# Patient Record
Sex: Female | Born: 1975 | Hispanic: No | Marital: Married | Smoking: Never smoker
Health system: Southern US, Community
[De-identification: ages and names within clinical notes are randomized; demographics above are authoritative.]

## PROBLEM LIST (undated history)

## (undated) DIAGNOSIS — O039 Complete or unspecified spontaneous abortion without complication: Secondary | ICD-10-CM

## (undated) DIAGNOSIS — Z789 Other specified health status: Secondary | ICD-10-CM

## (undated) DIAGNOSIS — B999 Unspecified infectious disease: Secondary | ICD-10-CM

## (undated) DIAGNOSIS — D649 Anemia, unspecified: Secondary | ICD-10-CM

## (undated) HISTORY — PX: NO PAST SURGERIES: SHX2092

## (undated) HISTORY — DX: Other specified health status: Z78.9

---

## 2011-01-23 ENCOUNTER — Inpatient Hospital Stay (HOSPITAL_COMMUNITY)
Admission: AD | Admit: 2011-01-23 | Discharge: 2011-01-23 | Disposition: A | Discharge status: Home / Self Care | Payer: Medicaid Other | Source: Ambulatory Visit | Attending: Obstetrics and Gynecology | Admitting: Obstetrics and Gynecology

## 2011-01-23 ENCOUNTER — Encounter (HOSPITAL_COMMUNITY): Payer: Self-pay | Admitting: *Deleted

## 2011-01-23 ENCOUNTER — Other Ambulatory Visit (HOSPITAL_COMMUNITY): Payer: Self-pay

## 2011-01-23 DIAGNOSIS — Z3201 Encounter for pregnancy test, result positive: Secondary | ICD-10-CM | POA: Insufficient documentation

## 2011-01-23 LAB — URINALYSIS, ROUTINE W REFLEX MICROSCOPIC
Bilirubin Urine: NEGATIVE
Glucose, UA: NEGATIVE mg/dL
Ketones, ur: NEGATIVE mg/dL
Protein, ur: NEGATIVE mg/dL

## 2011-01-23 LAB — CBC
Platelets: 162 10*3/uL (ref 150–400)
RBC: 3.89 MIL/uL (ref 3.87–5.11)
WBC: 8.1 10*3/uL (ref 4.0–10.5)

## 2011-01-23 LAB — URINE MICROSCOPIC-ADD ON

## 2011-01-23 LAB — ABO/RH: ABO/RH(D): O POS

## 2011-01-23 LAB — HCG, QUANTITATIVE, PREGNANCY: hCG, Beta Chain, Quant, S: 22585 m[IU]/mL — ABNORMAL HIGH (ref <5)

## 2011-01-23 MED ORDER — PRENATAL RX 60-1 MG PO TABS: 1.0000 | ORAL_TABLET | Freq: Every day | ORAL | Status: DC

## 2011-01-23 NOTE — ED Provider Notes (Signed)
History   Pt presents today wishing to have a preg test. She speaks Mandarin and all communication was performed via interpreter. Pt reports that she has had some mild discomfort and some "black discharge like the end of her period." Pt declines any pelvic examination. She denies fever, severe pain, or any other sx at this time.  Chief Complaint  Patient presents with  . Possible Pregnancy   HPI  OB History    Grav Para Term Preterm Abortions TAB SAB Ect Mult Living   3 1 1  0 1 1 0 0 0 1      Past Medical History  Diagnosis Date  . No pertinent past medical history     Past Surgical History  Procedure Date  . No past surgeries     No family history on file.  History  Substance Use Topics  . Smoking status: Never Smoker   . Smokeless tobacco: Not on file  . Alcohol Use: No    Allergies: No Known Allergies  No prescriptions prior to admission    Review of Systems  Constitutional: Negative for fever.  Eyes: Negative for blurred vision.  Cardiovascular: Negative for chest pain and palpitations.  Gastrointestinal: Positive for abdominal pain. Negative for nausea, vomiting, diarrhea and constipation.  Genitourinary: Negative for dysuria, urgency, frequency and hematuria.  Neurological: Negative for dizziness and headaches.  Psychiatric/Behavioral: Negative for depression and suicidal ideas.   Physical Exam   Blood pressure 99/41, pulse 71, temperature 99.1 F (37.3 C), resp. rate 16, height 5\' 5"  (1.651 m), weight 141 lb (63.957 kg), last menstrual period 12/14/2010.  Physical Exam  Nursing note and vitals reviewed. Constitutional: She appears well-developed and well-nourished. No distress.  HENT:  Head: Normocephalic and atraumatic.  Eyes: EOM are normal. Pupils are equal, round, and reactive to light.  GI: Soft. She exhibits no distension. There is no tenderness. There is no rebound and no guarding.  Skin: Skin is warm and dry. She is not diaphoretic.    Psychiatric: She has a normal mood and affect. Her behavior is normal. Judgment and thought content normal.    MAU Course  Procedures  Results for orders placed during the hospital encounter of 01/23/11 (from the past 24 hour(s))  URINALYSIS, ROUTINE W REFLEX MICROSCOPIC     Status: Abnormal   Collection Time   01/23/11  7:15 PM      Component Value Range   Color, Urine YELLOW  YELLOW    APPearance CLEAR  CLEAR    Specific Gravity, Urine <1.005 (*) 1.005 - 1.030    pH 6.0  5.0 - 8.0    Glucose, UA NEGATIVE  NEGATIVE (mg/dL)   Hgb urine dipstick TRACE (*) NEGATIVE    Bilirubin Urine NEGATIVE  NEGATIVE    Ketones, ur NEGATIVE  NEGATIVE (mg/dL)   Protein, ur NEGATIVE  NEGATIVE (mg/dL)   Urobilinogen, UA 0.2  0.0 - 1.0 (mg/dL)   Nitrite NEGATIVE  NEGATIVE    Leukocytes, UA NEGATIVE  NEGATIVE   URINE MICROSCOPIC-ADD ON     Status: Normal   Collection Time   01/23/11  7:15 PM      Component Value Range   Squamous Epithelial / LPF RARE  RARE    WBC, UA    <3 (WBC/hpf)   Value: NO FORMED ELEMENTS SEEN ON URINE MICROSCOPIC EXAMINATION   RBC / HPF 0-2  <3 (RBC/hpf)   Bacteria, UA RARE  RARE   POCT PREGNANCY, URINE     Status: Normal  Collection Time   01/23/11  7:35 PM      Component Value Range   Preg Test, Ur POSITIVE    ABO/RH     Status: Normal   Collection Time   01/23/11  8:01 PM      Component Value Range   ABO/RH(D) O POS    CBC     Status: Abnormal   Collection Time   01/23/11  8:01 PM      Component Value Range   WBC 8.1  4.0 - 10.5 (K/uL)   RBC 3.89  3.87 - 5.11 (MIL/uL)   Hemoglobin 12.4  12.0 - 15.0 (g/dL)   HCT 96.0 (*) 45.4 - 46.0 (%)   MCV 91.3  78.0 - 100.0 (fL)   MCH 31.9  26.0 - 34.0 (pg)   MCHC 34.9  30.0 - 36.0 (g/dL)   RDW 09.8  11.9 - 14.7 (%)   Platelets 162  150 - 400 (K/uL)     Assessment and Plan  Preg confirmation: pt now refuses ultrasound. She states she only wants pregnancy verification. I did discuss with her the possibility of ectopic preg  via the interpreter. Pt will be given preg verification letter and instructed to return immediately with worsening or changing symptoms.  Clinton Gallant. Rice III, DrHSc, MPAS, PA-C  01/23/2011, 8:02 PM   Henrietta Hoover, PA 01/23/11 2031

## 2011-01-23 NOTE — Progress Notes (Signed)
Pt states she feels some discomfort in her abdomen that she rates a 1. Pt states she also has a small amount of black discharge when she wipes

## 2011-01-23 NOTE — Progress Notes (Signed)
Per pt's family(pt doesn't speak english) she came in because she wants to find out if she is pregnant. States she has not had a home preg test but she "doesn't feel like eating". States her last LMP was 12/14/2010. occassional lower abd pain , none now. G2P1

## 2011-01-24 NOTE — ED Provider Notes (Signed)
Attestation of Attending Supervision of Advanced Practitioner: Evaluation and management procedures were performed by the PA/NP/CNM/OB Fellow under my supervision/collaboration. Chart reviewed and agree with management and plan.  Renada Cronin V 01/24/2011 9:51 AM

## 2011-02-14 ENCOUNTER — Inpatient Hospital Stay (HOSPITAL_COMMUNITY)
Admission: AD | Admit: 2011-02-14 | Discharge: 2011-02-14 | Disposition: A | Discharge status: Home / Self Care | Payer: Medicaid Other | Source: Ambulatory Visit | Attending: Obstetrics & Gynecology | Admitting: Obstetrics & Gynecology

## 2011-02-14 DIAGNOSIS — Z3400 Encounter for supervision of normal first pregnancy, unspecified trimester: Secondary | ICD-10-CM | POA: Insufficient documentation

## 2011-02-14 NOTE — ED Provider Notes (Signed)
[id number] presents to MAU reporting that she needs to have an ultrasound today.  She denies pain, cramping, LOF, vaginal bleeding, or any problems with her pregnancy.  She and her s/o speak only Mandarin and she is with a Nurse, learning disability.    She was seen in MAU for vaginal bleeding on 01/23/11 and had a positive UPT.  She refused an U/S at that time.   Brought pt to triage room and via translator explained that we are an emergency room and that a routine U/S cannot be performed tonight.  Gave pt and s/o information about providers in area and discussed calling to make appointment to begin prenatal care as soon as possible.  Pt states understanding via translator and left MAU at this time.  Sharen Counter, CNM

## 2011-02-14 NOTE — Progress Notes (Signed)
Pt is nonEng speaking and has a [place] friend along who states the pt was told to come back for an [place]

## 2011-04-09 LAB — OB RESULTS CONSOLE ANTIBODY SCREEN: Antibody Screen: NEGATIVE

## 2011-04-09 LAB — OB RESULTS CONSOLE ABO/RH

## 2011-04-09 LAB — OB RESULTS CONSOLE GC/CHLAMYDIA
Chlamydia: NEGATIVE
Gonorrhea: NEGATIVE

## 2011-04-10 ENCOUNTER — Other Ambulatory Visit (HOSPITAL_COMMUNITY): Payer: Self-pay | Admitting: Obstetrics and Gynecology

## 2011-04-10 DIAGNOSIS — O09529 Supervision of elderly multigravida, unspecified trimester: Secondary | ICD-10-CM

## 2011-04-18 ENCOUNTER — Ambulatory Visit (HOSPITAL_COMMUNITY)
Admission: RE | Admit: 2011-04-18 | Discharge: 2011-04-18 | Disposition: A | Discharge status: Home / Self Care | Payer: Medicaid Other | Source: Ambulatory Visit | Attending: Obstetrics and Gynecology | Admitting: Obstetrics and Gynecology

## 2011-04-18 ENCOUNTER — Ambulatory Visit (HOSPITAL_COMMUNITY): Payer: Medicaid Other

## 2011-04-18 ENCOUNTER — Other Ambulatory Visit: Payer: Self-pay

## 2011-04-18 ENCOUNTER — Other Ambulatory Visit (HOSPITAL_COMMUNITY): Payer: Self-pay | Admitting: Obstetrics and Gynecology

## 2011-04-18 ENCOUNTER — Encounter (HOSPITAL_COMMUNITY): Payer: Self-pay

## 2011-04-18 ENCOUNTER — Ambulatory Visit (HOSPITAL_COMMUNITY): Admission: RE | Admit: 2011-04-18 | Payer: Medicaid Other | Source: Ambulatory Visit

## 2011-04-18 DIAGNOSIS — Z1389 Encounter for screening for other disorder: Secondary | ICD-10-CM | POA: Insufficient documentation

## 2011-04-18 DIAGNOSIS — O09529 Supervision of elderly multigravida, unspecified trimester: Secondary | ICD-10-CM

## 2011-04-18 DIAGNOSIS — O358XX0 Maternal care for other (suspected) fetal abnormality and damage, not applicable or unspecified: Secondary | ICD-10-CM | POA: Insufficient documentation

## 2011-04-18 DIAGNOSIS — Z363 Encounter for antenatal screening for malformations: Secondary | ICD-10-CM | POA: Insufficient documentation

## 2011-04-19 ENCOUNTER — Telehealth (HOSPITAL_COMMUNITY): Payer: Self-pay | Admitting: MS"

## 2011-04-19 NOTE — Telephone Encounter (Signed)
Per arrangements from yesterday's visit, called Mandarin Chinese/English interpreter, Alvino Chapel with Ms. Cameran Gossman's preliminary preliminary FISH results from her amniocentesis. Asked interpreter to conference call patient with results. She stated that patient was at work, and that it had previously been decided that she would call them to relay results to them. We reviewed that these are within normal limits.  We again discussed the limitations of FISH and that final results are still pending and will be available in 1-2 weeks. However, the patient was not available to ask questions regarding these results. I instructed interpreter to let patient know she can call with additional questions or concerns.   Quinn Plowman, MS Patent attorney

## 2011-04-22 ENCOUNTER — Other Ambulatory Visit: Payer: Self-pay

## 2011-04-23 ENCOUNTER — Other Ambulatory Visit (HOSPITAL_COMMUNITY): Payer: Medicaid Other

## 2011-04-23 ENCOUNTER — Encounter (HOSPITAL_COMMUNITY): Payer: Self-pay

## 2011-04-26 ENCOUNTER — Telehealth (HOSPITAL_COMMUNITY): Payer: Self-pay | Admitting: MS"

## 2011-04-26 NOTE — Telephone Encounter (Signed)
Called Sharon Powers to discuss the final karyotype results from her amniocentesis. Spoke with patient's husband via Air cabin crew interpreter [phone] [facility]). We reviewed that these are within normal limits (46,XX).   Quinn Plowman, MS Patent attorney

## 2011-05-16 ENCOUNTER — Encounter (HOSPITAL_COMMUNITY): Payer: Self-pay | Admitting: *Deleted

## 2011-09-09 ENCOUNTER — Encounter (HOSPITAL_COMMUNITY): Payer: Self-pay | Admitting: *Deleted

## 2011-09-09 ENCOUNTER — Telehealth (HOSPITAL_COMMUNITY): Payer: Self-pay | Admitting: *Deleted

## 2011-09-09 NOTE — Telephone Encounter (Signed)
Preadmission screen [phone]

## 2011-09-13 ENCOUNTER — Inpatient Hospital Stay (HOSPITAL_COMMUNITY)
Admission: AD | Admit: 2011-09-13 | Discharge: 2011-09-15 | DRG: 775 | Disposition: A | Discharge status: Home / Self Care | Payer: Medicaid Other | Source: Ambulatory Visit | Attending: Obstetrics and Gynecology | Admitting: Obstetrics and Gynecology

## 2011-09-13 ENCOUNTER — Encounter (HOSPITAL_COMMUNITY): Payer: Self-pay | Admitting: *Deleted

## 2011-09-13 DIAGNOSIS — O09529 Supervision of elderly multigravida, unspecified trimester: Secondary | ICD-10-CM | POA: Diagnosis present

## 2011-09-13 LAB — CBC
Hemoglobin: 9.7 g/dL — ABNORMAL LOW (ref 12.0–15.0)
MCHC: 32.7 g/dL (ref 30.0–36.0)
RDW: 13.3 % (ref 11.5–15.5)

## 2011-09-13 LAB — RPR: RPR Ser Ql: NONREACTIVE

## 2011-09-13 MED ORDER — PRENATAL MULTIVITAMIN CH
1.0000 | ORAL_TABLET | Freq: Every day | ORAL | Status: DC
  Administered 2011-09-13 – 2011-09-15 (×3): 1 via ORAL
  Filled 2011-09-13 (×3): qty 1

## 2011-09-13 MED ORDER — WITCH HAZEL-GLYCERIN EX PADS: 1.0000 "application " | MEDICATED_PAD | CUTANEOUS | Status: DC | PRN

## 2011-09-13 MED ORDER — ONDANSETRON HCL 4 MG PO TABS: 4.0000 mg | ORAL_TABLET | ORAL | Status: DC | PRN

## 2011-09-13 MED ORDER — LACTATED RINGERS IV SOLN
INTRAVENOUS | Status: DC
  Administered 2011-09-13: 04:00:00 via INTRAVENOUS

## 2011-09-13 MED ORDER — BUTORPHANOL TARTRATE 1 MG/ML IJ SOLN: 1.0000 mg | Freq: Once | INTRAMUSCULAR | Status: DC

## 2011-09-13 MED ORDER — LACTATED RINGERS IV SOLN: 500.0000 mL | INTRAVENOUS | Status: DC | PRN

## 2011-09-13 MED ORDER — TETANUS-DIPHTH-ACELL PERTUSSIS 5-2.5-18.5 LF-MCG/0.5 IM SUSP: 0.5000 mL | Freq: Once | INTRAMUSCULAR | Status: DC

## 2011-09-13 MED ORDER — CITRIC ACID-SODIUM CITRATE 334-500 MG/5ML PO SOLN: 30.0000 mL | ORAL | Status: DC | PRN

## 2011-09-13 MED ORDER — ONDANSETRON HCL 4 MG/2ML IJ SOLN: 4.0000 mg | INTRAMUSCULAR | Status: DC | PRN

## 2011-09-13 MED ORDER — SIMETHICONE 80 MG PO CHEW: 80.0000 mg | CHEWABLE_TABLET | ORAL | Status: DC | PRN

## 2011-09-13 MED ORDER — LACTATED RINGERS IV SOLN: INTRAVENOUS | Status: DC

## 2011-09-13 MED ORDER — LIDOCAINE HCL (PF) 1 % IJ SOLN
30.0000 mL | INTRAMUSCULAR | Status: DC | PRN
  Administered 2011-09-13: 30 mL via SUBCUTANEOUS
  Filled 2011-09-13: qty 30

## 2011-09-13 MED ORDER — OXYTOCIN 40 UNITS IN LACTATED RINGERS INFUSION - SIMPLE MED: 62.5000 mL/h | INTRAVENOUS | Status: AC | PRN

## 2011-09-13 MED ORDER — MEASLES, MUMPS & RUBELLA VAC ~~LOC~~ INJ
0.5000 mL | INJECTION | Freq: Once | SUBCUTANEOUS | Status: DC
  Filled 2011-09-13: qty 0.5

## 2011-09-13 MED ORDER — OXYTOCIN 40 UNITS IN LACTATED RINGERS INFUSION - SIMPLE MED
62.5000 mL/h | Freq: Once | INTRAVENOUS | Status: AC
  Administered 2011-09-13: 62.5 mL/h via INTRAVENOUS
  Filled 2011-09-13: qty 1000

## 2011-09-13 MED ORDER — IBUPROFEN 600 MG PO TABS
600.0000 mg | ORAL_TABLET | Freq: Four times a day (QID) | ORAL | Status: DC
  Administered 2011-09-13 – 2011-09-15 (×9): 600 mg via ORAL
  Filled 2011-09-13 (×9): qty 1

## 2011-09-13 MED ORDER — LANOLIN HYDROUS EX OINT: TOPICAL_OINTMENT | CUTANEOUS | Status: DC | PRN

## 2011-09-13 MED ORDER — OXYCODONE-ACETAMINOPHEN 5-325 MG PO TABS: 1.0000 | ORAL_TABLET | ORAL | Status: DC | PRN

## 2011-09-13 MED ORDER — FLEET ENEMA 7-19 GM/118ML RE ENEM: 1.0000 | ENEMA | RECTAL | Status: DC | PRN

## 2011-09-13 MED ORDER — DIBUCAINE 1 % RE OINT: 1.0000 "application " | TOPICAL_OINTMENT | RECTAL | Status: DC | PRN

## 2011-09-13 MED ORDER — OXYTOCIN BOLUS FROM INFUSION
250.0000 mL | Freq: Once | INTRAVENOUS | Status: AC
  Administered 2011-09-13: 250 mL via INTRAVENOUS
  Filled 2011-09-13: qty 500

## 2011-09-13 MED ORDER — ACETAMINOPHEN 325 MG PO TABS: 650.0000 mg | ORAL_TABLET | ORAL | Status: DC | PRN

## 2011-09-13 MED ORDER — ZOLPIDEM TARTRATE 5 MG PO TABS: 5.0000 mg | ORAL_TABLET | Freq: Every evening | ORAL | Status: DC | PRN

## 2011-09-13 MED ORDER — IBUPROFEN 600 MG PO TABS: 600.0000 mg | ORAL_TABLET | Freq: Four times a day (QID) | ORAL | Status: DC | PRN

## 2011-09-13 MED ORDER — SENNOSIDES-DOCUSATE SODIUM 8.6-50 MG PO TABS
2.0000 | ORAL_TABLET | Freq: Every day | ORAL | Status: DC
  Administered 2011-09-14: 2 via ORAL

## 2011-09-13 MED ORDER — BENZOCAINE-MENTHOL 20-0.5 % EX AERO: 1.0000 "application " | INHALATION_SPRAY | CUTANEOUS | Status: DC | PRN

## 2011-09-13 MED ORDER — OXYCODONE-ACETAMINOPHEN 5-325 MG PO TABS
1.0000 | ORAL_TABLET | ORAL | Status: DC | PRN
Start: 2011-09-13 — End: 2011-09-15
  Administered 2011-09-14 – 2011-09-15 (×4): 1 via ORAL
  Filled 2011-09-13 (×4): qty 1

## 2011-09-13 MED ORDER — DIPHENHYDRAMINE HCL 25 MG PO CAPS: 25.0000 mg | ORAL_CAPSULE | Freq: Four times a day (QID) | ORAL | Status: DC | PRN

## 2011-09-13 MED ORDER — ONDANSETRON HCL 4 MG/2ML IJ SOLN: 4.0000 mg | Freq: Four times a day (QID) | INTRAMUSCULAR | Status: DC | PRN

## 2011-09-13 NOTE — Progress Notes (Signed)
Patient ID: Sharon Powers, female   DOB: 21-Dec-1975, 36 y.o.   MRN: [id number] DOD no complaints but language barrier

## 2011-09-13 NOTE — MAU Note (Signed)
Pt started having contractions 0220

## 2011-09-13 NOTE — Progress Notes (Signed)
UR chart review completed.  

## 2011-09-13 NOTE — H&P (Signed)
[name], Sharon Powers                     ACCOUNT NO.:  [id number]  MEDICAL RECORD NO.:  [id number]  LOCATION:  9169                          FACILITY:  [facility]  PHYSICIAN:  Malachi Pro. Ambrose Mantle, M.D. DATE OF BIRTH:  08-03-1975  DATE OF ADMISSION:  09/13/2011 DATE OF DISCHARGE:                             HISTORY & PHYSICAL   HISTORY OF PRESENT ILLNESS:  This is a 36 year old Asian female, para 1- 0-1, gravida 2, last period December 14, 2010, presented to our office at 16 weeks and 4/7th weeks gestation for her first prenatal visit. Ultrasound confirmed the date of 4 days earlier.  The patient had a quad screen that increased her risk of Down syndrome, and I even though I do not have the amniocentesis report.  The FISH was normal as a preliminary report.  The patient's prenatal course was essentially uncomplicated. She began contracting at home tonight and came to the hospital, was 4 cm in [place], was observed an hour and was found to progress to 6 cm.  Blood group and type O positive.  Negative antibody.  Pap smear normal. Rubella immune.  RPR nonreactive.  Urine culture negative.  Hepatitis B surface antigen negative, HIV negative, GC and Chlamydia negative.  She was too advanced for first trimester screen.  Cystic fibrosis screen was negative.  One-hour Glucola was 94.  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  Group B strep was negative. past medical history  No significant medical or surgical history.  No family history.  The patient denied alcohol, illicit substance abuse or tobacco during pregnancy.  OBSTETRIC HISTORY:  In 2002, she delivered an 8 pounds female infant vaginally in [place], claims that she almost did not make it to the hospital.  PHYSICAL EXAMINATION:  GENERAL:  A well-developed well-nourished Asian female, blood pressure 141/81, pulse 85, temperature has not been recorded. HEART:  Normal size and sounds.  No  murmurs. LUNGS:  Clear to auscultation.  Fundal height compatible with dates. CERVIX:  6-7 cm, 100%, vertex, -1 station.  We did speak to a translator over the telephone, and the anesthesiologist does not warm to given an epidural without a translator at the bedside.  So are going with IV pain medication, and do the best we can without the epidural.     Malachi Pro. Ambrose Mantle, M.D.     TFH/MEDQ  D:  09/13/2011  T:  09/13/2011  Job:  [id number]

## 2011-09-13 NOTE — Progress Notes (Signed)
Pt through interpreter answers md that she would like an epidural, unable to determine if pt really understands what this procedure is, anesthesia consulted and says to see if pt dilates quickly and then he will decide if epidural is safe since no interpretor would be a bedside, md says pt may have iv pain medication

## 2011-09-14 LAB — CBC
HCT: 27.8 % — ABNORMAL LOW (ref 36.0–46.0)
MCH: 28.4 pg (ref 26.0–34.0)
MCHC: 32.7 g/dL (ref 30.0–36.0)
MCV: 86.9 fL (ref 78.0–100.0)
Platelets: 183 10*3/uL (ref 150–400)
RDW: 13.6 % (ref 11.5–15.5)

## 2011-09-14 NOTE — Progress Notes (Signed)
This is a copy of the delivery note done by Dr. Ambrose Mantle for this pt that was in the wrong chart:  Pt progressed quickly to full dilatation and pushed well. Bradycardia developed in spite of lateral displacement of the uterus so a LML episiotomy was done under local block and the pt delivered with 1 contraction a living female infant 8 pounds 12 ounces with Apgars of 9 and 9 at 1 and 5 minutes. The presentation was LOA. The placenta delivered intact and there were separate membranes that were removed. The uterus was normal. There was a left vaginal laceration that was repaired with the LML episiotomy. Suture was 2-0 and 3-0 vicryl EBL 400 cc's

## 2011-09-14 NOTE — Progress Notes (Signed)
PPD #1 No problems with limited interpretation by husband Afeb, VSS Fundus firm, NT at U-1 Continue routine postpartum care

## 2011-09-15 MED ORDER — IBUPROFEN 600 MG PO TABS: 600.0000 mg | ORAL_TABLET | Freq: Four times a day (QID) | ORAL | Status: AC

## 2011-09-15 NOTE — Discharge Summary (Signed)
Obstetric Discharge Summary Reason for Admission: onset of labor Prenatal Procedures: none Intrapartum Procedures: spontaneous vaginal delivery and episiotomy LML Postpartum Procedures: none Complications-Operative and Postpartum: none Hemoglobin  Date Value Range Status  09/14/2011 9.1* 12.0 - 15.0 g/dL Final     HCT  Date Value Range Status  09/14/2011 27.8* 36.0 - 46.0 % Final    Physical Exam:  General: alert Lochia: appropriate Uterine Fundus: firm  Discharge Diagnoses: Term Pregnancy-delivered  Discharge Information: Date: 09/15/2011 Activity: pelvic rest Diet: routine Medications: Ibuprofen Condition: stable Instructions: refer to practice specific booklet Discharge to: home Follow-up Information    Follow up with [name], MD in 6 weeks.   Contact information:   [address], [address] [place] [address] [phone]          Newborn Data: Live born female  Birth Weight: 8 lb 12 oz (3969 g) APGAR: 9, 9  Home with mother.  Sharon Powers D 09/15/2011, 10:43 AM

## 2011-09-15 NOTE — Progress Notes (Signed)
PPD #2 No problems Afeb, VSS Fundus firm, NT at U-1 Continue routine postpartum care, d/c home 

## 2011-09-16 LAB — TYPE AND SCREEN
ABO/RH(D): O POS
Antibody Screen: NEGATIVE
Unit division: 0
Unit division: 0

## 2011-09-17 ENCOUNTER — Inpatient Hospital Stay (HOSPITAL_COMMUNITY): Admission: RE | Admit: 2011-09-17 | Payer: Medicaid Other | Source: Ambulatory Visit

## 2012-01-20 ENCOUNTER — Other Ambulatory Visit: Payer: Self-pay | Admitting: Specialist

## 2012-01-20 ENCOUNTER — Ambulatory Visit
Admission: RE | Admit: 2012-01-20 | Discharge: 2012-01-20 | Disposition: A | Discharge status: Home / Self Care | Payer: No Typology Code available for payment source | Source: Ambulatory Visit | Attending: Specialist | Admitting: Specialist

## 2012-01-20 DIAGNOSIS — R6889 Other general symptoms and signs: Secondary | ICD-10-CM

## 2013-11-22 ENCOUNTER — Encounter (HOSPITAL_COMMUNITY): Payer: Self-pay | Admitting: *Deleted

## 2016-04-19 ENCOUNTER — Encounter (HOSPITAL_BASED_OUTPATIENT_CLINIC_OR_DEPARTMENT_OTHER): Payer: Self-pay

## 2016-04-19 ENCOUNTER — Emergency Department (HOSPITAL_BASED_OUTPATIENT_CLINIC_OR_DEPARTMENT_OTHER)
Admission: EM | Admit: 2016-04-19 | Discharge: 2016-04-19 | Disposition: A | Discharge status: Home / Self Care | Payer: BLUE CROSS/BLUE SHIELD | Attending: Emergency Medicine | Admitting: Emergency Medicine

## 2016-04-19 DIAGNOSIS — N12 Tubulo-interstitial nephritis, not specified as acute or chronic: Secondary | ICD-10-CM | POA: Insufficient documentation

## 2016-04-19 DIAGNOSIS — R1032 Left lower quadrant pain: Secondary | ICD-10-CM | POA: Diagnosis present

## 2016-04-19 LAB — CBC WITH DIFFERENTIAL/PLATELET
BASOS ABS: 0 10*3/uL (ref 0.0–0.1)
Basophils Relative: 0 %
Eosinophils Absolute: 0 10*3/uL (ref 0.0–0.7)
Eosinophils Relative: 0 %
HEMATOCRIT: 35.8 % — AB (ref 36.0–46.0)
HEMOGLOBIN: 12.3 g/dL (ref 12.0–15.0)
Lymphocytes Relative: 8 %
Lymphs Abs: 0.7 10*3/uL (ref 0.7–4.0)
MCH: 31.7 pg (ref 26.0–34.0)
MCHC: 34.4 g/dL (ref 30.0–36.0)
MCV: 92.3 fL (ref 78.0–100.0)
Monocytes Absolute: 0.6 10*3/uL (ref 0.1–1.0)
Monocytes Relative: 7 %
NEUTROS ABS: 8.3 10*3/uL — AB (ref 1.7–7.7)
NEUTROS PCT: 85 %
Platelets: 149 10*3/uL — ABNORMAL LOW (ref 150–400)
RBC: 3.88 MIL/uL (ref 3.87–5.11)
RDW: 12.6 % (ref 11.5–15.5)
WBC: 9.6 10*3/uL (ref 4.0–10.5)

## 2016-04-19 LAB — URINALYSIS, MICROSCOPIC (REFLEX)

## 2016-04-19 LAB — URINALYSIS, ROUTINE W REFLEX MICROSCOPIC
Bilirubin Urine: NEGATIVE
Glucose, UA: NEGATIVE mg/dL
KETONES UR: NEGATIVE mg/dL
NITRITE: POSITIVE — AB
Protein, ur: 30 mg/dL — AB
Specific Gravity, Urine: 1.014 (ref 1.005–1.030)
pH: 7 (ref 5.0–8.0)

## 2016-04-19 LAB — COMPREHENSIVE METABOLIC PANEL
ALK PHOS: 50 U/L (ref 38–126)
ALT: 14 U/L (ref 14–54)
ANION GAP: 7 (ref 5–15)
AST: 18 U/L (ref 15–41)
Albumin: 4.4 g/dL (ref 3.5–5.0)
BILIRUBIN TOTAL: 0.9 mg/dL (ref 0.3–1.2)
BUN: 12 mg/dL (ref 6–20)
CO2: 27 mmol/L (ref 22–32)
Calcium: 9.2 mg/dL (ref 8.9–10.3)
Chloride: 100 mmol/L — ABNORMAL LOW (ref 101–111)
Creatinine, Ser: 0.68 mg/dL (ref 0.44–1.00)
Glucose, Bld: 115 mg/dL — ABNORMAL HIGH (ref 65–99)
Potassium: 4 mmol/L (ref 3.5–5.1)
SODIUM: 134 mmol/L — AB (ref 135–145)
TOTAL PROTEIN: 7.8 g/dL (ref 6.5–8.1)

## 2016-04-19 LAB — PREGNANCY, URINE: Preg Test, Ur: NEGATIVE

## 2016-04-19 MED ORDER — CEPHALEXIN 500 MG PO CAPS: 500.0000 mg | ORAL_CAPSULE | Freq: Three times a day (TID) | ORAL | 0 refills | Status: DC

## 2016-04-19 MED ORDER — DEXTROSE 5 % IV SOLN
1.0000 g | Freq: Once | INTRAVENOUS | Status: AC
  Administered 2016-04-19: 1 g via INTRAVENOUS
  Filled 2016-04-19: qty 10

## 2016-04-19 MED ORDER — ACETAMINOPHEN 325 MG PO TABS
650.0000 mg | ORAL_TABLET | Freq: Once | ORAL | Status: AC | PRN
  Administered 2016-04-19: 650 mg via ORAL
  Filled 2016-04-19: qty 2

## 2016-04-19 MED ORDER — SODIUM CHLORIDE 0.9 % IV BOLUS (SEPSIS)
1000.0000 mL | Freq: Once | INTRAVENOUS | Status: AC
  Administered 2016-04-19: 1000 mL via INTRAVENOUS

## 2016-04-19 NOTE — Discharge Planning (Signed)
Pt up for discharge. EDCM reviewed chart for possible CM needs.  No needs identified or communicated.  

## 2016-04-19 NOTE — ED Provider Notes (Signed)
[facility] Note   CSN: [id number] Arrival date & time: 04/19/16  1143     History   Chief Complaint Chief Complaint  Patient presents with  . Dizziness    HPI Sharon Powers Ante is a 41 y.o. female.  The history is provided by the patient and a relative. A language interpreter was used.  Dizziness   Sharon Powers is a 41 y.o. female who presents to the Emergency Department complaining of back pain, dizziness.  She presents to emergency department accompanied by her daughter for evaluation of dizziness and low back pain. She's been feeling poorly since yesterday with pain in her left low back and left lower quadrant with associated nausea and dizziness. No reports of head injury, vomiting, dysuria. Symptoms are moderate and constant in nature. Past Medical History:  Diagnosis Date  . Language barrier    Mandarin [place]  . No pertinent past medical history     There are no active problems to display for this patient.   Past Surgical History:  Procedure Laterality Date  . NO PAST SURGERIES      OB History    Gravida Para Term Preterm AB Living   0 0 2   SAB TAB Ectopic Multiple Live Births   0 0 0 0 2       Home Medications    Prior to Admission medications   Medication Sig Start Date End Date Taking? Authorizing Provider  cephALEXin (KEFLEX) 500 MG capsule Take 1 capsule (500 mg total) by mouth 3 (three) times daily. 04/19/16   Tilden Fossa, MD    Family History Family History  Problem Relation Age of Onset  . Alcohol abuse Neg Hx   . Arthritis Neg Hx   . Asthma Neg Hx   . Birth defects Neg Hx   . Cancer Neg Hx   . COPD Neg Hx   . Depression Neg Hx   . Diabetes Neg Hx   . Drug abuse Neg Hx   . Early death Neg Hx   . Hearing loss Neg Hx   . Heart disease Neg Hx   . Hyperlipidemia Neg Hx   . Hypertension Neg Hx   . Kidney disease Neg Hx   . Learning disabilities Neg Hx   . Mental illness Neg Hx   . Mental retardation Neg Hx    . Miscarriages / Stillbirths Neg Hx   . Stroke Neg Hx   . Vision loss Neg Hx     Social History Social History  Substance Use Topics  . Smoking status: Never Smoker  . Smokeless tobacco: Never Used  . Alcohol use No     Allergies   Patient has no known allergies.   Review of Systems Review of Systems  Neurological: Positive for dizziness.  All other systems reviewed and are negative.    Physical Exam Updated Vital Signs BP 108/72 (BP Location: Right Arm)   Pulse 98   Temp 100.2 F (37.9 C) (Oral)   Resp 18   LMP 03/30/2016   Physical Exam  Constitutional: She is oriented to person, place, and time. She appears well-developed and well-nourished.  HENT:  Head: Normocephalic and atraumatic.  Cardiovascular: Normal rate and regular rhythm.   No murmur heard. Pulmonary/Chest: Effort normal and breath sounds normal. No respiratory distress.  Abdominal: Soft. There is no tenderness. There is no rebound and no guarding.  Left CVA tenderness  Musculoskeletal: She exhibits no edema or tenderness.  Neurological: She is alert and oriented to person, place, and time.  Skin: Skin is warm and dry.  Psychiatric: She has a normal mood and affect. Her behavior is normal.  Nursing note and vitals reviewed.    ED Treatments / Results  Labs (all labs ordered are listed, but only abnormal results are displayed) Labs Reviewed  URINALYSIS, ROUTINE W REFLEX MICROSCOPIC - Abnormal; Notable for the following:       Result Value   APPearance CLOUDY (*)    Hgb urine dipstick LARGE (*)    Protein, ur 30 (*)    Nitrite POSITIVE (*)    Leukocytes, UA LARGE (*)    All other components within normal limits  URINALYSIS, MICROSCOPIC (REFLEX) - Abnormal; Notable for the following:    Bacteria, UA MANY (*)    Squamous Epithelial / LPF TOO NUMEROUS TO COUNT (*)    All other components within normal limits  COMPREHENSIVE METABOLIC PANEL - Abnormal; Notable for the following:    Sodium  134 (*)    Chloride 100 (*)    Glucose, Bld 115 (*)    All other components within normal limits  CBC WITH DIFFERENTIAL/PLATELET - Abnormal; Notable for the following:    HCT 35.8 (*)    Platelets 149 (*)    Neutro Abs 8.3 (*)    All other components within normal limits  URINE CULTURE  PREGNANCY, URINE    EKG  EKG Interpretation None       Radiology No results found.  Procedures Procedures (including critical care time)  Medications Ordered in ED Medications  acetaminophen (TYLENOL) tablet 650 mg (650 mg Oral Given 04/19/16 1241)  sodium chloride 0.9 % bolus 1,000 mL (0 mLs Intravenous Stopped 04/19/16 1424)  cefTRIAXone (ROCEPHIN) 1 g in dextrose 5 % 50 mL IVPB (0 g Intravenous Stopped 04/19/16 1416)     Initial Impression / Assessment and Plan / ED Course  I have reviewed the triage vital signs and the nursing notes.  Pertinent labs & imaging results that were available during my care of the patient were reviewed by me and considered in my medical decision making (see chart for details).     Patient here for evaluation of low back pain and fevers. She is nontoxic appearing on examination with no abdominal tenderness. She does have CVA tenderness on exam. UA is concerning for UTI in setting of her symptoms even though epithelial cells are present. Providing IV fluids and antibiotics for urinary tract infection. Discussed home care, outpatient follow-up and return precautions.  Presentation is not consistent with appendicitis, diverticulitis.  Final Clinical Impressions(s) / ED Diagnoses   Final diagnoses:  Pyelonephritis    New Prescriptions New Prescriptions   CEPHALEXIN (KEFLEX) 500 MG CAPSULE    Take 1 capsule (500 mg total) by mouth 3 (three) times daily.     Tilden Fossa, MD 04/19/16 1430

## 2016-04-19 NOTE — ED Triage Notes (Signed)
Thru interpreter line-pt c/o dizziness, lower back pain, chills,fever x 2 days-NAD-steady gait

## 2016-04-21 ENCOUNTER — Encounter (HOSPITAL_BASED_OUTPATIENT_CLINIC_OR_DEPARTMENT_OTHER): Payer: Self-pay | Admitting: Emergency Medicine

## 2016-04-21 ENCOUNTER — Inpatient Hospital Stay (HOSPITAL_BASED_OUTPATIENT_CLINIC_OR_DEPARTMENT_OTHER)
Admission: EM | Admit: 2016-04-21 | Discharge: 2016-04-23 | DRG: 690 | Disposition: A | Discharge status: Home / Self Care | Payer: BLUE CROSS/BLUE SHIELD | Attending: Internal Medicine | Admitting: Internal Medicine

## 2016-04-21 ENCOUNTER — Emergency Department (HOSPITAL_BASED_OUTPATIENT_CLINIC_OR_DEPARTMENT_OTHER): Payer: BLUE CROSS/BLUE SHIELD

## 2016-04-21 DIAGNOSIS — N10 Acute tubulo-interstitial nephritis: Principal | ICD-10-CM | POA: Diagnosis present

## 2016-04-21 DIAGNOSIS — B9620 Unspecified Escherichia coli [E. coli] as the cause of diseases classified elsewhere: Secondary | ICD-10-CM | POA: Diagnosis present

## 2016-04-21 DIAGNOSIS — E876 Hypokalemia: Secondary | ICD-10-CM | POA: Diagnosis present

## 2016-04-21 DIAGNOSIS — R509 Fever, unspecified: Secondary | ICD-10-CM

## 2016-04-21 DIAGNOSIS — D696 Thrombocytopenia, unspecified: Secondary | ICD-10-CM | POA: Diagnosis present

## 2016-04-21 DIAGNOSIS — N12 Tubulo-interstitial nephritis, not specified as acute or chronic: Secondary | ICD-10-CM

## 2016-04-21 DIAGNOSIS — N151 Renal and perinephric abscess: Secondary | ICD-10-CM

## 2016-04-21 DIAGNOSIS — D649 Anemia, unspecified: Secondary | ICD-10-CM | POA: Diagnosis present

## 2016-04-21 DIAGNOSIS — D6959 Other secondary thrombocytopenia: Secondary | ICD-10-CM | POA: Diagnosis present

## 2016-04-21 DIAGNOSIS — Z789 Other specified health status: Secondary | ICD-10-CM

## 2016-04-21 LAB — CBC WITH DIFFERENTIAL/PLATELET
Basophils Absolute: 0 10*3/uL (ref 0.0–0.1)
Basophils Relative: 0 %
Eosinophils Absolute: 0 10*3/uL (ref 0.0–0.7)
Eosinophils Relative: 0 %
HEMATOCRIT: 34.9 % — AB (ref 36.0–46.0)
Hemoglobin: 11.9 g/dL — ABNORMAL LOW (ref 12.0–15.0)
LYMPHS ABS: 0.8 10*3/uL (ref 0.7–4.0)
LYMPHS PCT: 15 %
MCH: 31.4 pg (ref 26.0–34.0)
MCHC: 34.1 g/dL (ref 30.0–36.0)
MCV: 92.1 fL (ref 78.0–100.0)
MONOS PCT: 15 %
Monocytes Absolute: 0.8 10*3/uL (ref 0.1–1.0)
NEUTROS ABS: 3.9 10*3/uL (ref 1.7–7.7)
Neutrophils Relative %: 70 %
Platelets: 144 10*3/uL — ABNORMAL LOW (ref 150–400)
RBC: 3.79 MIL/uL — ABNORMAL LOW (ref 3.87–5.11)
RDW: 12.5 % (ref 11.5–15.5)
WBC: 5.6 10*3/uL (ref 4.0–10.5)

## 2016-04-21 LAB — BASIC METABOLIC PANEL
Anion gap: 9 (ref 5–15)
BUN: 7 mg/dL (ref 6–20)
CALCIUM: 8.4 mg/dL — AB (ref 8.9–10.3)
CHLORIDE: 102 mmol/L (ref 101–111)
CO2: 23 mmol/L (ref 22–32)
CREATININE: 0.51 mg/dL (ref 0.44–1.00)
GFR calc Af Amer: >60 mL/min (ref >60)
GFR calc non Af Amer: >60 mL/min (ref >60)
GLUCOSE: 110 mg/dL — AB (ref 65–99)
Potassium: 3.4 mmol/L — ABNORMAL LOW (ref 3.5–5.1)
Sodium: 134 mmol/L — ABNORMAL LOW (ref 135–145)

## 2016-04-21 LAB — URINE CULTURE

## 2016-04-21 LAB — URINALYSIS, MICROSCOPIC (REFLEX)

## 2016-04-21 LAB — URINALYSIS, ROUTINE W REFLEX MICROSCOPIC
BILIRUBIN URINE: NEGATIVE
GLUCOSE, UA: NEGATIVE mg/dL
KETONES UR: NEGATIVE mg/dL
Nitrite: NEGATIVE
PH: 6 (ref 5.0–8.0)
Protein, ur: NEGATIVE mg/dL
SPECIFIC GRAVITY, URINE: 1.004 — AB (ref 1.005–1.030)

## 2016-04-21 MED ORDER — SODIUM CHLORIDE 0.9 % IV BOLUS (SEPSIS)
1000.0000 mL | Freq: Once | INTRAVENOUS | Status: AC
  Administered 2016-04-21: 1000 mL via INTRAVENOUS

## 2016-04-21 MED ORDER — ACETAMINOPHEN 325 MG PO TABS
650.0000 mg | ORAL_TABLET | Freq: Once | ORAL | Status: AC
  Administered 2016-04-21: 650 mg via ORAL
  Filled 2016-04-21: qty 2

## 2016-04-21 MED ORDER — DEXTROSE 5 % IV SOLN
2.0000 g | Freq: Once | INTRAVENOUS | Status: AC
  Administered 2016-04-21: 2 g via INTRAVENOUS
  Filled 2016-04-21: qty 2

## 2016-04-21 MED ORDER — IOPAMIDOL (ISOVUE-300) INJECTION 61%
100.0000 mL | Freq: Once | INTRAVENOUS | Status: AC | PRN
  Administered 2016-04-21: 100 mL via INTRAVENOUS

## 2016-04-21 MED ORDER — CEFTRIAXONE SODIUM 1 G IJ SOLR: 1.0000 g | Freq: Once | INTRAMUSCULAR | Status: DC

## 2016-04-21 MED ORDER — DEXTROSE 5 % IV SOLN: 1.0000 g | Freq: Once | INTRAVENOUS | Status: DC

## 2016-04-21 NOTE — ED Notes (Signed)
Blood cultures obtained x2.  

## 2016-04-21 NOTE — ED Provider Notes (Signed)
[facility] Provider Note   CSN: [id number] Arrival date & time: 04/21/16  1749   By signing my name below, I, Soijett Blue, attest that this documentation has been prepared under the direction and in the presence of Lyndal Pulley, MD. Electronically Signed: Soijett Blue, ED Scribe. 04/21/16. 6:49 PM.  History   Chief Complaint No chief complaint on file.   HPI Sharon Powers is a 41 y.o. female who presents to the Emergency Department complaining of intermittent fever onset 2 days ago. Pt reports associated left flank pain. Pt has tried Rx 7 day course of keflex with no relief of her symptoms. She notes that she was seen in the ED several days ago and diagnosed with an UTI and Rx keflex. She denies abdominal pain and any other symptoms.    The history is provided by the patient and the spouse. A language interpreter was used (Mandarin, [place]).    Past Medical History:  Diagnosis Date  . Language barrier    Mandarin [place]  . No pertinent past medical history     There are no active problems to display for this patient.   Past Surgical History:  Procedure Laterality Date  . NO PAST SURGERIES      OB History    Gravida Para Term Preterm AB Living   0 0 2   SAB TAB Ectopic Multiple Live Births   0 0 0 0 2       Home Medications    Prior to Admission medications   Medication Sig Start Date End Date Taking? Authorizing Provider  cephALEXin (KEFLEX) 500 MG capsule Take 1 capsule (500 mg total) by mouth 3 (three) times daily. 04/19/16   Tilden Fossa, MD    Family History Family History  Problem Relation Age of Onset  . Alcohol abuse Neg Hx   . Arthritis Neg Hx   . Asthma Neg Hx   . Birth defects Neg Hx   . Cancer Neg Hx   . COPD Neg Hx   . Depression Neg Hx   . Diabetes Neg Hx   . Drug abuse Neg Hx   . Early death Neg Hx   . Hearing loss Neg Hx   . Heart disease Neg Hx   . Hyperlipidemia Neg Hx   . Hypertension Neg Hx   . Kidney  disease Neg Hx   . Learning disabilities Neg Hx   . Mental illness Neg Hx   . Mental retardation Neg Hx   . Miscarriages / Stillbirths Neg Hx   . Stroke Neg Hx   . Vision loss Neg Hx     Social History Social History  Substance Use Topics  . Smoking status: Never Smoker  . Smokeless tobacco: Never Used  . Alcohol use No     Allergies   Patient has no known allergies.   Review of Systems Review of Systems  All other systems reviewed and are negative.    Physical Exam Updated Vital Signs BP 115/70 (BP Location: Left Arm)   Pulse (!) 104   Temp (!) 101.5 F (38.6 C) (Oral)   Resp (!) 24   LMP 03/30/2016   SpO2 97%   Physical Exam  Constitutional: She is oriented to person, place, and time. She appears well-developed and well-nourished. No distress.  HENT:  Head: Normocephalic.  Nose: Nose normal.  Eyes: Conjunctivae are normal.  Neck: Neck supple. No tracheal deviation present.  Cardiovascular: Normal rate and regular rhythm.  Pulmonary/Chest: Effort normal. No respiratory distress.  Abdominal: Soft. She exhibits no distension. There is CVA tenderness.  Left CVA tenderness  Neurological: She is alert and oriented to person, place, and time.  Skin: Skin is warm and dry.  Psychiatric: She has a normal mood and affect.     ED Treatments / Results  DIAGNOSTIC STUDIES: Oxygen Saturation is 97% on RA, nl by my interpretation.    COORDINATION OF CARE: 6:48 PM Discussed treatment plan with pt at bedside which includes labs, UA, CT abdomen with pelvis, and pt agreed to plan.  8:38 PM- Consult with Dr. Toniann Fail who recommends admission to the hospital.   Labs (all labs ordered are listed, but only abnormal results are displayed) Labs Reviewed  CBC WITH DIFFERENTIAL/PLATELET - Abnormal; Notable for the following:       Result Value   RBC 3.79 (*)    Hemoglobin 11.9 (*)    HCT 34.9 (*)    Platelets 144 (*)    All other components within normal limits    BASIC METABOLIC PANEL - Abnormal; Notable for the following:    Sodium 134 (*)    Potassium 3.4 (*)    Glucose, Bld 110 (*)    Calcium 8.4 (*)    All other components within normal limits  URINALYSIS, ROUTINE W REFLEX MICROSCOPIC - Abnormal; Notable for the following:    Specific Gravity, Urine 1.004 (*)    Hgb urine dipstick LARGE (*)    Leukocytes, UA SMALL (*)    All other components within normal limits  URINALYSIS, MICROSCOPIC (REFLEX) - Abnormal; Notable for the following:    Bacteria, UA RARE (*)    Squamous Epithelial / LPF 0-5 (*)    All other components within normal limits  URINE CULTURE  CULTURE, BLOOD (ROUTINE X 2)  CULTURE, BLOOD (ROUTINE X 2)    EKG  EKG Interpretation None       Radiology Ct Abdomen Pelvis W Contrast  Result Date: 04/21/2016 CLINICAL DATA:  Bilateral flank pain x1 week. History of pyelonephritis. EXAM: CT ABDOMEN AND PELVIS WITH CONTRAST TECHNIQUE: Multidetector CT imaging of the abdomen and pelvis was performed using the standard protocol following bolus administration of intravenous contrast. CONTRAST:  ISOVUE-300 IOPAMIDOL (ISOVUE-300) INJECTION 61% COMPARISON:  None. FINDINGS: LOWER CHEST: Lung bases are clear. Included heart size is normal. No pericardial effusion. HEPATOBILIARY: No biliary dilatation. Small hypodensity adjacent to the falciform may represent fatty infiltration. Tiny polyp along the nondependent wall of the gallbladder measuring 2-3 mm, series 2, image 30 is suggested. PANCREAS: Normal. SPLEEN: Normal. ADRENALS/URINARY TRACT: Kidneys are orthotopic, demonstrating subtle ill-defined interpolar to left lower pole hypodensity on the initial postcontrast images with loss of cortical rim on delayed imaging, series 7, image 26. No nephrolithiasis, hydronephrosis or solid renal masses. The unopacified ureters are normal in course and caliber. Urinary bladder is partially distended and unremarkable. Normal adrenal glands.  STOMACH/BOWEL: The stomach, small and large bowel are normal in course and caliber without inflammatory changes. Normal appendix. VASCULAR/LYMPHATIC: Aortoiliac vessels are normal in course and caliber. No lymphadenopathy by CT size criteria. REPRODUCTIVE: Normal. OTHER: No intraperitoneal free fluid or free air. MUSCULOSKELETAL: Nonacute. IMPRESSION: Ill-defined hypodensity involving the mid to lower left kidney with loss of cortical rim enhancement suggesting developing lobar nephronia, an intermediate stage between acute pyelonephritis and renal abscess. Tiny 2-3 mm nondependent gallbladder wall density suspicious for tiny polyp. Electronically Signed   By: Tollie Eth M.D.   On: 04/21/2016 19:59  Procedures Procedures (including critical care time)  Medications Ordered in ED Medications  acetaminophen (TYLENOL) tablet 650 mg (650 mg Oral Given 04/21/16 1836)  iopamidol (ISOVUE-300) 61 % injection 100 mL (100 mLs Intravenous Contrast Given 04/21/16 1907)  sodium chloride 0.9 % bolus 1,000 mL (1,000 mLs Intravenous Transfusing/Transfer 04/21/16 2224)  cefTRIAXone (ROCEPHIN) 2 g in dextrose 5 % 50 mL IVPB (0 g Intravenous Stopped 04/21/16 2224)     Initial Impression / Assessment and Plan / ED Course  I have reviewed the triage vital signs and the nursing notes.  Pertinent labs & imaging results that were available during my care of the patient were reviewed by me and considered in my medical decision making (see chart for details).     41 y.o. female presents with recent diagnosis for pyelonephritis clinically and placed on keflex at home. Has continued to have fevers despite ABx therapy, CT to evaluate for complicated UTI. Previous culture was pan-sensitive e coli.  Has signs of pyelo vs early renal abscess, will treat with 2g rocephin and admit for failure of OP therapy.   Final Clinical Impressions(s) / ED Diagnoses   Final diagnoses:  Failure of outpatient treatment  Fever in adult    Pyelonephritis  Renal abscess    New Prescriptions New Prescriptions   No medications on file   I personally performed the services described in this documentation, which was scribed in my presence. The recorded information has been reviewed and is accurate.      Lyndal Pulley, MD 04/22/16 [phone]

## 2016-04-21 NOTE — ED Notes (Signed)
Pt had ibuprofen 1 hour ago.

## 2016-04-21 NOTE — ED Notes (Addendum)
Spoke with interpreter service in Mandarin and MD explained the admission process. RN also explained the admission process through the interpreter and all questions were answered.  Pt denies any pain at present. Hospital information (directions and room number) given to pt's husband.

## 2016-04-21 NOTE — ED Triage Notes (Signed)
Pt seen here a few days ago dx with pyelonephritis, states symptoms continue. Taking abx as prescribed. Pt speaks [place].

## 2016-04-21 NOTE — ED Notes (Signed)
Used Video for interpreter

## 2016-04-21 NOTE — ED Notes (Signed)
Patient updated by RN via interpreter # [phone].

## 2016-04-22 ENCOUNTER — Encounter (HOSPITAL_COMMUNITY): Payer: Self-pay | Admitting: Internal Medicine

## 2016-04-22 ENCOUNTER — Telehealth: Payer: Self-pay | Admitting: Emergency Medicine

## 2016-04-22 DIAGNOSIS — E876 Hypokalemia: Secondary | ICD-10-CM | POA: Diagnosis present

## 2016-04-22 DIAGNOSIS — N12 Tubulo-interstitial nephritis, not specified as acute or chronic: Secondary | ICD-10-CM

## 2016-04-22 DIAGNOSIS — D649 Anemia, unspecified: Secondary | ICD-10-CM

## 2016-04-22 DIAGNOSIS — N10 Acute tubulo-interstitial nephritis: Secondary | ICD-10-CM | POA: Diagnosis present

## 2016-04-22 DIAGNOSIS — R509 Fever, unspecified: Secondary | ICD-10-CM | POA: Diagnosis not present

## 2016-04-22 DIAGNOSIS — D696 Thrombocytopenia, unspecified: Secondary | ICD-10-CM | POA: Diagnosis present

## 2016-04-22 DIAGNOSIS — N151 Renal and perinephric abscess: Secondary | ICD-10-CM | POA: Diagnosis present

## 2016-04-22 DIAGNOSIS — D6959 Other secondary thrombocytopenia: Secondary | ICD-10-CM | POA: Diagnosis present

## 2016-04-22 DIAGNOSIS — B9620 Unspecified Escherichia coli [E. coli] as the cause of diseases classified elsewhere: Secondary | ICD-10-CM | POA: Diagnosis present

## 2016-04-22 DIAGNOSIS — Z789 Other specified health status: Secondary | ICD-10-CM | POA: Diagnosis not present

## 2016-04-22 LAB — COMPREHENSIVE METABOLIC PANEL
ALBUMIN: 3.4 g/dL — AB (ref 3.5–5.0)
ALK PHOS: 45 U/L (ref 38–126)
ALT: 17 U/L (ref 14–54)
ANION GAP: 8 (ref 5–15)
AST: 23 U/L (ref 15–41)
BILIRUBIN TOTAL: 0.4 mg/dL (ref 0.3–1.2)
BUN: 5 mg/dL — ABNORMAL LOW (ref 6–20)
CALCIUM: 8.1 mg/dL — AB (ref 8.9–10.3)
CO2: 24 mmol/L (ref 22–32)
Chloride: 105 mmol/L (ref 101–111)
Creatinine, Ser: 0.54 mg/dL (ref 0.44–1.00)
GFR calc Af Amer: >60 mL/min (ref >60)
GFR calc non Af Amer: >60 mL/min (ref >60)
GLUCOSE: 94 mg/dL (ref 65–99)
Potassium: 3.4 mmol/L — ABNORMAL LOW (ref 3.5–5.1)
Sodium: 137 mmol/L (ref 135–145)
Total Protein: 6.7 g/dL (ref 6.5–8.1)

## 2016-04-22 LAB — CBC WITH DIFFERENTIAL/PLATELET
BASOS PCT: 0 %
Basophils Absolute: 0 10*3/uL (ref 0.0–0.1)
Eosinophils Absolute: 0 10*3/uL (ref 0.0–0.7)
Eosinophils Relative: 0 %
HEMATOCRIT: 33.9 % — AB (ref 36.0–46.0)
HEMOGLOBIN: 11.3 g/dL — AB (ref 12.0–15.0)
LYMPHS ABS: 1.2 10*3/uL (ref 0.7–4.0)
LYMPHS PCT: 26 %
MCH: 30.4 pg (ref 26.0–34.0)
MCHC: 33.3 g/dL (ref 30.0–36.0)
MCV: 91.1 fL (ref 78.0–100.0)
MONO ABS: 0.7 10*3/uL (ref 0.1–1.0)
MONOS PCT: 15 %
NEUTROS PCT: 59 %
Neutro Abs: 2.7 10*3/uL (ref 1.7–7.7)
Platelets: 142 10*3/uL — ABNORMAL LOW (ref 150–400)
RBC: 3.72 MIL/uL — ABNORMAL LOW (ref 3.87–5.11)
RDW: 13 % (ref 11.5–15.5)
WBC: 4.6 10*3/uL (ref 4.0–10.5)

## 2016-04-22 LAB — VITAMIN B12: VITAMIN B 12: 569 pg/mL (ref 180–914)

## 2016-04-22 LAB — RETICULOCYTES
RBC.: 3.72 MIL/uL — ABNORMAL LOW (ref 3.87–5.11)
Retic Count, Absolute: 52.1 10*3/uL (ref 19.0–186.0)
Retic Ct Pct: 1.4 % (ref 0.4–3.1)

## 2016-04-22 LAB — HIV ANTIBODY (ROUTINE TESTING W REFLEX): HIV Screen 4th Generation wRfx: NONREACTIVE

## 2016-04-22 LAB — IRON AND TIBC
Iron: 10 ug/dL — ABNORMAL LOW (ref 28–170)
SATURATION RATIOS: 4 % — AB (ref 10.4–31.8)
TIBC: 286 ug/dL (ref 250–450)
UIBC: 276 ug/dL

## 2016-04-22 LAB — FERRITIN: Ferritin: 86 ng/mL (ref 11–307)

## 2016-04-22 LAB — FOLATE: FOLATE: 19 ng/mL (ref >5.9)

## 2016-04-22 MED ORDER — ONDANSETRON HCL 4 MG/2ML IJ SOLN: 4.0000 mg | Freq: Four times a day (QID) | INTRAMUSCULAR | Status: DC | PRN

## 2016-04-22 MED ORDER — ONDANSETRON HCL 4 MG PO TABS: 4.0000 mg | ORAL_TABLET | Freq: Four times a day (QID) | ORAL | Status: DC | PRN

## 2016-04-22 MED ORDER — ACETAMINOPHEN 325 MG PO TABS
650.0000 mg | ORAL_TABLET | Freq: Four times a day (QID) | ORAL | Status: DC | PRN
  Administered 2016-04-22: 650 mg via ORAL
  Filled 2016-04-22: qty 2

## 2016-04-22 MED ORDER — DEXTROSE 5 % IV SOLN
2.0000 g | INTRAVENOUS | Status: DC
  Administered 2016-04-22: 2 g via INTRAVENOUS
  Filled 2016-04-22 (×2): qty 2

## 2016-04-22 MED ORDER — SODIUM CHLORIDE 0.9 % IV SOLN
INTRAVENOUS | Status: AC
  Administered 2016-04-22: via INTRAVENOUS
  Administered 2016-04-22: 100 mL/h via INTRAVENOUS
  Administered 2016-04-22: 04:00:00 via INTRAVENOUS

## 2016-04-22 MED ORDER — ACETAMINOPHEN 650 MG RE SUPP: 650.0000 mg | Freq: Four times a day (QID) | RECTAL | Status: DC | PRN

## 2016-04-22 NOTE — H&P (Addendum)
History and Physical    [name] ZOX:[id number] DOB: [birth year] DOA: 04/21/2016  PCP: No PCP Per Patient  Patient coming from: Home.  Warden/ranger used.  Chief Complaint: Persistent fever.  HPI: Sharon Powers is a 41 y.o. female with no significant past medical history had come to the ER 3 days ago with fever chills and dysuria. At that time patient was diagnosed with UTI and discharged home on Keflex. Despite taking which patient had persistent fever chills and left flank pain. Denies any chest pain shortness of breath productive cough nausea vomiting or diarrhea.  ED Course: CT of the abdomen done shows possible early abscess versus pyelonephritis of the left kidney. Blood cultures were sent and patient was started on ceftriaxone. Urine cultures done 3 days ago shows Escherichia coli pansensitive.  Review of Systems: As per HPI, rest all negative.   Past Medical History:  Diagnosis Date  . Language barrier    Mandarin [place]  . No pertinent past medical history     Past Surgical History:  Procedure Laterality Date  . NO PAST SURGERIES       reports that she has never smoked. She has never used smokeless tobacco. She reports that she does not drink alcohol or use drugs.  No Known Allergies  Family History  Problem Relation Age of Onset  . Alcohol abuse Neg Hx   . Arthritis Neg Hx   . Asthma Neg Hx   . Birth defects Neg Hx   . Cancer Neg Hx   . COPD Neg Hx   . Depression Neg Hx   . Diabetes Neg Hx   . Drug abuse Neg Hx   . Early death Neg Hx   . Hearing loss Neg Hx   . Heart disease Neg Hx   . Hyperlipidemia Neg Hx   . Hypertension Neg Hx   . Kidney disease Neg Hx   . Learning disabilities Neg Hx   . Mental illness Neg Hx   . Mental retardation Neg Hx   . Miscarriages / Stillbirths Neg Hx   . Stroke Neg Hx   . Vision loss Neg Hx     Prior to Admission medications   Medication Sig Start Date End Date Taking? Authorizing Provider  cephALEXin  (KEFLEX) 500 MG capsule Take 1 capsule (500 mg total) by mouth 3 (three) times daily. 04/19/16   Tilden Fossa, MD    Physical Exam: Vitals:   04/21/16 2217 04/21/16 2223 04/21/16 2226 04/21/16 2310  BP:   106/60 (!) 105/58  Pulse:  66  69  Resp:    16  Temp: 98.3 F (36.8 C)   98.4 F (36.9 C)  TempSrc:    Oral  SpO2:  100%  100%      Constitutional: Moderately built and nourished. Vitals:   04/21/16 2217 04/21/16 2223 04/21/16 2226 04/21/16 2310  BP:   106/60 (!) 105/58  Pulse:  66  69  Resp:    16  Temp: 98.3 F (36.8 C)   98.4 F (36.9 C)  TempSrc:    Oral  SpO2:  100%  100%   Eyes: Anicteric no pallor. ENMT: No discharge from the ears eyes nose and mouth. Neck: No mass felt. No neck rigidity. Respiratory: No rhonchi or crepitations. Cardiovascular: S1-S2 heard no murmurs appreciated. Abdomen: Left flank pain. No guarding or rigidity. Musculoskeletal: No edema. Skin: No rash. Skin appears warm. Neurologic: Alert awake oriented to time place and person. Moves all extremities.  Psychiatric: Appears normal. Normal affect.   Labs on Admission: I have personally reviewed following labs and imaging studies  CBC:  Recent Labs Lab 04/19/16 1330 04/21/16 1835  WBC 9.6 5.6  NEUTROABS 8.3* 3.9  HGB 12.3 11.9*  HCT 35.8* 34.9*  MCV 92.3 92.1  PLT 149* 144*   Basic Metabolic Panel:  Recent Labs Lab 04/19/16 1330 04/21/16 1835  NA 134* 134*  K 4.0 3.4*  CL 100* 102  CO2 27 23  GLUCOSE 115* 110*  BUN 12 7  CREATININE 0.68 0.51  CALCIUM 9.2 8.4*   GFR: CrCl cannot be calculated (Unknown ideal weight.). Liver Function Tests:  Recent Labs Lab 04/19/16 1330  AST 18  ALT 14  ALKPHOS 50  BILITOT 0.9  PROT 7.8  ALBUMIN 4.4   No results for input(s): LIPASE, AMYLASE in the last 168 hours. No results for input(s): AMMONIA in the last 168 hours. Coagulation Profile: No results for input(s): INR, PROTIME in the last 168 hours. Cardiac Enzymes: No  results for input(s): CKTOTAL, CKMB, CKMBINDEX, TROPONINI in the last 168 hours. BNP (last 3 results) No results for input(s): PROBNP in the last 8760 hours. HbA1C: No results for input(s): HGBA1C in the last 72 hours. CBG: No results for input(s): GLUCAP in the last 168 hours. Lipid Profile: No results for input(s): CHOL, HDL, LDLCALC, TRIG, CHOLHDL, LDLDIRECT in the last 72 hours. Thyroid Function Tests: No results for input(s): TSH, T4TOTAL, FREET4, T3FREE, THYROIDAB in the last 72 hours. Anemia Panel: No results for input(s): VITAMINB12, FOLATE, FERRITIN, TIBC, IRON, RETICCTPCT in the last 72 hours. Urine analysis:    Component Value Date/Time   COLORURINE YELLOW 04/21/2016 1835   APPEARANCEUR CLEAR 04/21/2016 1835   LABSPEC 1.004 (L) 04/21/2016 1835   PHURINE 6.0 04/21/2016 1835   GLUCOSEU NEGATIVE 04/21/2016 1835   HGBUR LARGE (A) 04/21/2016 1835   BILIRUBINUR NEGATIVE 04/21/2016 1835   KETONESUR NEGATIVE 04/21/2016 1835   PROTEINUR NEGATIVE 04/21/2016 1835   UROBILINOGEN 0.2 01/23/2011 1915   NITRITE NEGATIVE 04/21/2016 1835   LEUKOCYTESUR SMALL (A) 04/21/2016 1835   Sepsis Labs: (procalcitonin:4,lacticidven:4) ) Recent Results (from the past 240 hour(s))  Urine culture     Status: Abnormal   Collection Time: 04/19/16 12:22 PM  Result Value Ref Range Status   Specimen Description URINE, RANDOM  Final   Special Requests NONE  Final   Culture >=100,000 COLONIES/mL ESCHERICHIA COLI (A)  Final   Report Status 04/21/2016 FINAL  Final   Organism ID, Bacteria ESCHERICHIA COLI (A)  Final      Susceptibility   Escherichia coli - MIC*    AMPICILLIN <=2 SENSITIVE Sensitive     CEFAZOLIN <=4 SENSITIVE Sensitive     CEFTRIAXONE <=1 SENSITIVE Sensitive     CIPROFLOXACIN <=0.25 SENSITIVE Sensitive     GENTAMICIN <=1 SENSITIVE Sensitive     IMIPENEM <=0.25 SENSITIVE Sensitive     NITROFURANTOIN <=16 SENSITIVE Sensitive     TRIMETH/SULFA <=20 SENSITIVE Sensitive      AMPICILLIN/SULBACTAM <=2 SENSITIVE Sensitive     PIP/TAZO <=4 SENSITIVE Sensitive     Extended ESBL NEGATIVE Sensitive     * >=100,000 COLONIES/mL ESCHERICHIA COLI     Radiological Exams on Admission: Ct Abdomen Pelvis W Contrast  Result Date: 04/21/2016 CLINICAL DATA:  Bilateral flank pain x1 week. History of pyelonephritis. EXAM: CT ABDOMEN AND PELVIS WITH CONTRAST TECHNIQUE: Multidetector CT imaging of the abdomen and pelvis was performed using the standard protocol following bolus administration of intravenous contrast.  CONTRAST:  ISOVUE-300 IOPAMIDOL (ISOVUE-300) INJECTION 61% COMPARISON:  None. FINDINGS: LOWER CHEST: Lung bases are clear. Included heart size is normal. No pericardial effusion. HEPATOBILIARY: No biliary dilatation. Small hypodensity adjacent to the falciform may represent fatty infiltration. Tiny polyp along the nondependent wall of the gallbladder measuring 2-3 mm, series 2, image 30 is suggested. PANCREAS: Normal. SPLEEN: Normal. ADRENALS/URINARY TRACT: Kidneys are orthotopic, demonstrating subtle ill-defined interpolar to left lower pole hypodensity on the initial postcontrast images with loss of cortical rim on delayed imaging, series 7, image 26. No nephrolithiasis, hydronephrosis or solid renal masses. The unopacified ureters are normal in course and caliber. Urinary bladder is partially distended and unremarkable. Normal adrenal glands. STOMACH/BOWEL: The stomach, small and large bowel are normal in course and caliber without inflammatory changes. Normal appendix. VASCULAR/LYMPHATIC: Aortoiliac vessels are normal in course and caliber. No lymphadenopathy by CT size criteria. REPRODUCTIVE: Normal. OTHER: No intraperitoneal free fluid or free air. MUSCULOSKELETAL: Nonacute. IMPRESSION: Ill-defined hypodensity involving the mid to lower left kidney with loss of cortical rim enhancement suggesting developing lobar nephronia, an intermediate stage between acute pyelonephritis  and renal abscess. Tiny 2-3 mm nondependent gallbladder wall density suspicious for tiny polyp. Electronically Signed   By: Tollie Eth M.D.   On: 04/21/2016 19:59     Assessment/Plan Active Problems:   Pyelonephritis   Normochromic normocytic anemia   Thrombocytopenia (HCC)    1. Left-sided pyelonephritis versus developing renal abscess - patient has been placed on ceftriaxone for pyelonephritis dose. Follow blood cultures. Continue with hydration. Discussed with Dr. Vernie Ammons on-call urologist who advised patient to be on IV antibiotics until patient's fever get better and patient will need 2 weeks of total duration of antibiotics. 2. Normocytic normochromic anemia - follow anemia panel and CBC. 3. Thrombocytopenia likely from infectious causes - no old labs to compare. Follow CBC.   DVT prophylaxis: SCDs. Code Status: Full code.  Family Communication: Patient's husband.  Disposition Plan: Home.  Consults called: None.  Admission status: Observation.    Eduard Clos MD [facility] Pager [phone].  If 7PM-7AM, please contact night-coverage [email or web address] Password [email or web address]  04/22/2016, 2:17 AM

## 2016-04-22 NOTE — Progress Notes (Signed)
Pharmacy Antibiotic Note  Sharon Powers is a 41 y.o. female admitted on 04/21/2016 with pyelo.  Pharmacy has been consulted for Ceftriaxone dosing. Pt had urine culture in ED on 3/30 that reveals pan-sensitive E Coli.   Plan: -Ceftriaxone 2g IV q24h -Trend WBC, temp  Temp (24hrs), Avg:99.1 F (37.3 C), Min:98.2 F (36.8 C), Max:101.5 F (38.6 C)   Recent Labs Lab 04/19/16 1330 04/21/16 1835  WBC 9.6 5.6  CREATININE 0.68 0.51    CrCl cannot be calculated (Unknown ideal weight.).    No Known Allergies   Abran Duke 04/22/2016 2:31 AM

## 2016-04-22 NOTE — Telephone Encounter (Signed)
Post ED Visit - Positive Culture Follow-up  Culture report reviewed by antimicrobial stewardship pharmacist:   Enzo Bi, Pharm.D.  Celedonio Miyamoto, Pharm.D., BCPS AQ-ID  Garvin Fila, Pharm.D., BCPS  Georgina Pillion, Pharm.D., BCPS  [place], [address].D., BCPS, AAHIVP  Estella Husk, Pharm.D., BCPS, AAHIVP  Lysle Pearl, PharmD, BCPS  Casilda Carls, PharmD, BCPS  Pollyann Samples, PharmD, BCPS  Positive urine culture Treated with cephalexin, organism sensitive to the same and no further patient follow-up is required at this time.  Berle Mull 04/22/2016, 12:42 PM

## 2016-04-22 NOTE — Progress Notes (Signed)
Patient admitted after midnight, please see H&P.  Attempted to use video interpreter but not working.  Plan is for IV rocephin until fever free for 24 hours and blood culture resulted.  Will need 14 days of abx treatment.  Suspect home in AM. Marlin Canary DO

## 2016-04-23 DIAGNOSIS — Z789 Other specified health status: Secondary | ICD-10-CM

## 2016-04-23 DIAGNOSIS — D696 Thrombocytopenia, unspecified: Secondary | ICD-10-CM

## 2016-04-23 LAB — URINE CULTURE: Culture: NO GROWTH

## 2016-04-23 MED ORDER — CEPHALEXIN 500 MG PO CAPS: 500.0000 mg | ORAL_CAPSULE | Freq: Two times a day (BID) | ORAL | 0 refills | Status: DC

## 2016-04-23 NOTE — Progress Notes (Signed)
Patient discharged to home with instructions given and translated by [facility] video interpreter.

## 2016-04-23 NOTE — Discharge Summary (Signed)
Physician Discharge Summary  Sharon Powers ZOX:[id number] DOB: May 12, 1975 DOA: 04/21/2016  PCP: No PCP Per Patient  Admit date: 04/21/2016 Discharge date: 04/23/2016   Recommendations for Outpatient Follow-Up:   14 days of abx for pyelonephritis Close follow up to ensure resolution   Discharge Diagnosis:   Active Problems:   Pyelonephritis   Normochromic normocytic anemia   Thrombocytopenia (HCC)   Discharge disposition:  Home.  Discharge Condition: Improved.  Diet recommendation:   Regular.  Wound care: None.   History of Present Illness:   Sharon Powers is a 41 y.o. female with no significant past medical history had come to the ER 3 days ago with fever chills and dysuria. At that time patient was diagnosed with UTI and discharged home on Keflex. Despite taking which patient had persistent fever chills and left flank pain. Denies any chest pain shortness of breath productive cough nausea vomiting or diarrhea.   Hospital Course by Problem:   Left sided pyelonephritis -patient much improved -low grade fevers -IV abx changed to PO for 14 days course -will need close outpatient follow up  Thrombocytopenia likely from infectious causes - no old labs to compare. Follow CBC.  Hypokalemia -repleted    Medical Consultants:    Urology (phone)   Discharge Exam:   Vitals:   04/22/16 2153 04/23/16 0504  BP: 103/60 (!) 111/55  Pulse: 85 71  Resp: 18 18  Temp: 98.4 F (36.9 C) 99.5 F (37.5 C)   Vitals:   04/22/16 0413 04/22/16 1532 04/22/16 2153 04/23/16 0504  BP: (!) 114/54 116/63 103/60 (!) 111/55  Pulse: 72 84 85 71  Resp: Temp: 99.1 F (37.3 C) 99.3 F (37.4 C) 98.4 F (36.9 C) 99.5 F (37.5 C)  TempSrc: Oral Oral Oral Oral  SpO2: 100% 100% 100% 98%    Gen:  NAD- spoke with patient using mandarin interpreter  The results of significant diagnostics from this hospitalization (including imaging, microbiology, ancillary and laboratory)  are listed below for reference.     Procedures and Diagnostic Studies:   Ct Abdomen Pelvis W Contrast  Result Date: 04/21/2016 CLINICAL DATA:  Bilateral flank pain x1 week. History of pyelonephritis. EXAM: CT ABDOMEN AND PELVIS WITH CONTRAST TECHNIQUE: Multidetector CT imaging of the abdomen and pelvis was performed using the standard protocol following bolus administration of intravenous contrast. CONTRAST:  ISOVUE-300 IOPAMIDOL (ISOVUE-300) INJECTION 61% COMPARISON:  None. FINDINGS: LOWER CHEST: Lung bases are clear. Included heart size is normal. No pericardial effusion. HEPATOBILIARY: No biliary dilatation. Small hypodensity adjacent to the falciform may represent fatty infiltration. Tiny polyp along the nondependent wall of the gallbladder measuring 2-3 mm, series 2, image 30 is suggested. PANCREAS: Normal. SPLEEN: Normal. ADRENALS/URINARY TRACT: Kidneys are orthotopic, demonstrating subtle ill-defined interpolar to left lower pole hypodensity on the initial postcontrast images with loss of cortical rim on delayed imaging, series 7, image 26. No nephrolithiasis, hydronephrosis or solid renal masses. The unopacified ureters are normal in course and caliber. Urinary bladder is partially distended and unremarkable. Normal adrenal glands. STOMACH/BOWEL: The stomach, small and large bowel are normal in course and caliber without inflammatory changes. Normal appendix. VASCULAR/LYMPHATIC: Aortoiliac vessels are normal in course and caliber. No lymphadenopathy by CT size criteria. REPRODUCTIVE: Normal. OTHER: No intraperitoneal free fluid or free air. MUSCULOSKELETAL: Nonacute. IMPRESSION: Ill-defined hypodensity involving the mid to lower left kidney with loss of cortical rim enhancement suggesting developing lobar nephronia, an intermediate stage between acute pyelonephritis and renal  abscess. Tiny 2-3 mm nondependent gallbladder wall density suspicious for tiny polyp. Electronically Signed   By: Tollie Eth M.D.   On: 04/21/2016 19:59     Labs:   Basic Metabolic Panel:  Recent Labs Lab 04/19/16 1330 04/21/16 1835 04/22/16 0301  NA 134* 134* 137  K 4.0 3.4* 3.4*  CL 100* 102 105  CO2 GLUCOSE 115* 110* 94  BUN 12 7 5*  CREATININE 0.68 0.51 0.54  CALCIUM 9.2 8.4* 8.1*   GFR CrCl cannot be calculated (Unknown ideal weight.). Liver Function Tests:  Recent Labs Lab 04/19/16 1330 04/22/16 0301  AST 18 23  ALT 14 17  ALKPHOS 50 45  BILITOT 0.9 0.4  PROT 7.8 6.7  ALBUMIN 4.4 3.4*   No results for input(s): LIPASE, AMYLASE in the last 168 hours. No results for input(s): AMMONIA in the last 168 hours. Coagulation profile No results for input(s): INR, PROTIME in the last 168 hours.  CBC:  Recent Labs Lab 04/19/16 1330 04/21/16 1835 04/22/16 0301  WBC 9.6 5.6 4.6  NEUTROABS 8.3* 3.9 2.7  HGB 12.3 11.9* 11.3*  HCT 35.8* 34.9* 33.9*  MCV 92.3 92.1 91.1  PLT 149* 144* 142*   Cardiac Enzymes: No results for input(s): CKTOTAL, CKMB, CKMBINDEX, TROPONINI in the last 168 hours. BNP: Invalid input(s): POCBNP CBG: No results for input(s): GLUCAP in the last 168 hours. D-Dimer No results for input(s): DDIMER in the last 72 hours. Hgb A1c No results for input(s): HGBA1C in the last 72 hours. Lipid Profile No results for input(s): CHOL, HDL, LDLCALC, TRIG, CHOLHDL, LDLDIRECT in the last 72 hours. Thyroid function studies No results for input(s): TSH, T4TOTAL, T3FREE, THYROIDAB in the last 72 hours.  Invalid input(s): FREET3 Anemia work up  Recent Labs  04/22/16 0301  VITAMINB12 569  FOLATE 19.0  FERRITIN 86  TIBC 286  IRON 10*  RETICCTPCT 1.4   Microbiology Recent Results (from the past 240 hour(s))  Urine culture     Status: Abnormal   Collection Time: 04/19/16 12:22 PM  Result Value Ref Range Status   Specimen Description URINE, RANDOM  Final   Special Requests NONE  Final   Culture >=100,000 COLONIES/mL ESCHERICHIA COLI (A)  Final    Report Status 04/21/2016 FINAL  Final   Organism ID, Bacteria ESCHERICHIA COLI (A)  Final      Susceptibility   Escherichia coli - MIC*    AMPICILLIN <=2 SENSITIVE Sensitive     CEFAZOLIN <=4 SENSITIVE Sensitive     CEFTRIAXONE <=1 SENSITIVE Sensitive     CIPROFLOXACIN <=0.25 SENSITIVE Sensitive     GENTAMICIN <=1 SENSITIVE Sensitive     IMIPENEM <=0.25 SENSITIVE Sensitive     NITROFURANTOIN <=16 SENSITIVE Sensitive     TRIMETH/SULFA <=20 SENSITIVE Sensitive     AMPICILLIN/SULBACTAM <=2 SENSITIVE Sensitive     PIP/TAZO <=4 SENSITIVE Sensitive     Extended ESBL NEGATIVE Sensitive     * >=100,000 COLONIES/mL ESCHERICHIA COLI  Urine culture     Status: None   Collection Time: 04/21/16  6:35 PM  Result Value Ref Range Status   Specimen Description URINE, CLEAN CATCH  Final   Special Requests NONE  Final   Culture   Final    NO GROWTH Performed at [facility], [address], [place], [place] [address]    Report Status 04/23/2016 FINAL  Final  Culture, blood (Routine X 2) w Reflex to ID Panel  Status: None (Preliminary result)   Collection Time: 04/21/16  8:35 PM  Result Value Ref Range Status   Specimen Description BLOOD RIGHT HAND  Final   Special Requests   Final    BOTTLES DRAWN AEROBIC AND ANAEROBIC Blood Culture adequate volume   Culture   Final    NO GROWTH < 12 HOURS Performed at [facility], [address] [place], [place] [address]    Report Status PENDING  Incomplete     Discharge Instructions:   Discharge Instructions    Diet general    Complete by:  As directed    Increase activity slowly    Complete by:  As directed    Be sure to take your antibiotics to completion Follow up with PCP to ensure that infection fully treated     Allergies as of 04/23/2016   No Known Allergies     Medication List    TAKE these medications   cephALEXin 500 MG capsule Commonly known as:  KEFLEX Take 1 capsule (500 mg total) by mouth 2 (two) times  daily. What changed:  when to take this   OVER THE COUNTER MEDICATION Take 2 tablets by mouth as needed (for fever). OTC Fever Medication         Time coordinating discharge: 35 min  Signed:  Shadrick Senne U Leida Luton   [facility] 04/23/2016, 11:40 AM

## 2016-04-26 LAB — CULTURE, BLOOD (ROUTINE X 2)
CULTURE: NO GROWTH
SPECIAL REQUESTS: ADEQUATE

## 2016-04-27 LAB — CULTURE, BLOOD (ROUTINE X 2)
Culture: NO GROWTH
SPECIAL REQUESTS: ADEQUATE

## 2016-08-08 ENCOUNTER — Encounter: Payer: Self-pay | Admitting: Emergency Medicine

## 2016-08-08 ENCOUNTER — Emergency Department (HOSPITAL_BASED_OUTPATIENT_CLINIC_OR_DEPARTMENT_OTHER): Payer: BLUE CROSS/BLUE SHIELD

## 2016-08-08 ENCOUNTER — Emergency Department (HOSPITAL_BASED_OUTPATIENT_CLINIC_OR_DEPARTMENT_OTHER)
Admission: EM | Admit: 2016-08-08 | Discharge: 2016-08-08 | Disposition: A | Discharge status: Home / Self Care | Payer: BLUE CROSS/BLUE SHIELD | Attending: Emergency Medicine | Admitting: Emergency Medicine

## 2016-08-08 DIAGNOSIS — O26891 Other specified pregnancy related conditions, first trimester: Secondary | ICD-10-CM | POA: Diagnosis not present

## 2016-08-08 DIAGNOSIS — Z3A01 Less than 8 weeks gestation of pregnancy: Secondary | ICD-10-CM | POA: Diagnosis not present

## 2016-08-08 DIAGNOSIS — N760 Acute vaginitis: Secondary | ICD-10-CM | POA: Insufficient documentation

## 2016-08-08 DIAGNOSIS — O9989 Other specified diseases and conditions complicating pregnancy, childbirth and the puerperium: Secondary | ICD-10-CM | POA: Diagnosis present

## 2016-08-08 DIAGNOSIS — R102 Pelvic and perineal pain: Secondary | ICD-10-CM | POA: Diagnosis not present

## 2016-08-08 DIAGNOSIS — B9689 Other specified bacterial agents as the cause of diseases classified elsewhere: Secondary | ICD-10-CM

## 2016-08-08 DIAGNOSIS — O23591 Infection of other part of genital tract in pregnancy, first trimester: Secondary | ICD-10-CM | POA: Diagnosis not present

## 2016-08-08 LAB — BASIC METABOLIC PANEL
ANION GAP: 10 (ref 5–15)
BUN: 9 mg/dL (ref 6–20)
CALCIUM: 9 mg/dL (ref 8.9–10.3)
CO2: 24 mmol/L (ref 22–32)
Chloride: 102 mmol/L (ref 101–111)
Creatinine, Ser: 0.48 mg/dL (ref 0.44–1.00)
Glucose, Bld: 102 mg/dL — ABNORMAL HIGH (ref 65–99)
Potassium: 3.6 mmol/L (ref 3.5–5.1)
Sodium: 136 mmol/L (ref 135–145)

## 2016-08-08 LAB — CBC WITH DIFFERENTIAL/PLATELET
BASOS ABS: 0 10*3/uL (ref 0.0–0.1)
BASOS PCT: 0 %
EOS ABS: 0.1 10*3/uL (ref 0.0–0.7)
Eosinophils Relative: 1 %
HCT: 34.2 % — ABNORMAL LOW (ref 36.0–46.0)
HEMOGLOBIN: 12 g/dL (ref 12.0–15.0)
Lymphocytes Relative: 25 %
Lymphs Abs: 1.9 10*3/uL (ref 0.7–4.0)
MCH: 31.3 pg (ref 26.0–34.0)
MCHC: 35.1 g/dL (ref 30.0–36.0)
MCV: 89.1 fL (ref 78.0–100.0)
MONO ABS: 0.5 10*3/uL (ref 0.1–1.0)
Monocytes Relative: 7 %
NEUTROS ABS: 5.1 10*3/uL (ref 1.7–7.7)
NEUTROS PCT: 67 %
Platelets: 186 10*3/uL (ref 150–400)
RBC: 3.84 MIL/uL — ABNORMAL LOW (ref 3.87–5.11)
RDW: 12 % (ref 11.5–15.5)
WBC: 7.6 10*3/uL (ref 4.0–10.5)

## 2016-08-08 LAB — ABO/RH: ABO/RH(D): O POS

## 2016-08-08 LAB — URINALYSIS, MICROSCOPIC (REFLEX)

## 2016-08-08 LAB — URINALYSIS, ROUTINE W REFLEX MICROSCOPIC
Bilirubin Urine: NEGATIVE
GLUCOSE, UA: NEGATIVE mg/dL
Ketones, ur: NEGATIVE mg/dL
Leukocytes, UA: NEGATIVE
Nitrite: NEGATIVE
PH: 6 (ref 5.0–8.0)
Protein, ur: NEGATIVE mg/dL
SPECIFIC GRAVITY, URINE: 1.018 (ref 1.005–1.030)

## 2016-08-08 LAB — WET PREP, GENITAL
Sperm: NONE SEEN
Trich, Wet Prep: NONE SEEN
Yeast Wet Prep HPF POC: NONE SEEN

## 2016-08-08 LAB — PREGNANCY, URINE: Preg Test, Ur: POSITIVE — AB

## 2016-08-08 LAB — HCG, QUANTITATIVE, PREGNANCY: HCG, BETA CHAIN, QUANT, S: 53722 m[IU]/mL — AB (ref <5)

## 2016-08-08 MED ORDER — METRONIDAZOLE 0.75 % VA GEL: 1.0000 | Freq: Two times a day (BID) | VAGINAL | 0 refills | Status: DC

## 2016-08-08 MED ORDER — COMPLETENATE 29-1 MG PO CHEW: 1.0000 | CHEWABLE_TABLET | Freq: Every day | ORAL | 0 refills | Status: DC

## 2016-08-08 NOTE — ED Notes (Signed)
Patient transported to Ultrasound 

## 2016-08-08 NOTE — ED Provider Notes (Signed)
[facility] Provider Note   CSN: [id number] Arrival date & time: 08/08/16  1801  By signing my name below, I, Cynda Acres, attest that this documentation has been prepared under the direction and in the presence of Dione Booze, MD. Electronically Signed: Cynda Acres, Scribe. 08/08/16. 7:41 PM.   History   Chief Complaint Chief Complaint  Patient presents with  . Abdominal Pain    HPI Comments: Sharon Powers is a 41 y.o. female with no pertinent past medical history, who presents to the Emergency Department complaining of sudden-onset, constant lower abdominal discomfort without radiation that began several days ago. Patient reports gradually worsening abdominal pain for several days. Patient is currently pregnant, LNMP was 06/25/16. Patient has not been evaluated by a gynecologist, but does have an appointment scheduled in 5 days. [id number]. Patient reports "coffee ground" appearing vaginal discharge and abdominal cramping. No medications or modifying factors indicated. Patient states urinating makes her pain worse, rest makes improves her pain. Patient denies any flank pain, dysuria, hematuria, vaginal discharge, vaginal bleeding, nausea, vomiting, diarrhea, or any additional symptoms.   The history is provided by the patient. The history is limited by a language barrier. A language interpreter was used.    Past Medical History:  Diagnosis Date  . Language barrier    Mandarin [place]  . No pertinent past medical history     Patient Active Problem List   Diagnosis Date Noted  . Normochromic normocytic anemia 04/22/2016  . Thrombocytopenia (HCC) 04/22/2016  . Pyelonephritis 04/21/2016    Past Surgical History:  Procedure Laterality Date  . NO PAST SURGERIES      OB History    Gravida Para Term Preterm AB Living   3 2 2  0 0 2   SAB TAB Ectopic Multiple Live Births   0 0 0 0 2       Home Medications    Prior to Admission medications   Medication Sig Start  Date End Date Taking? Authorizing Provider  cephALEXin (KEFLEX) 500 MG capsule Take 1 capsule (500 mg total) by mouth 2 (two) times daily. 04/23/16   Joseph Art, DO  OVER THE COUNTER MEDICATION Take 2 tablets by mouth as needed (for fever). OTC Fever Medication    [provider]    Family History Family History  Problem Relation Age of Onset  . Alcohol abuse Neg Hx   . Arthritis Neg Hx   . Asthma Neg Hx   . Birth defects Neg Hx   . Cancer Neg Hx   . COPD Neg Hx   . Depression Neg Hx   . Diabetes Neg Hx   . Drug abuse Neg Hx   . Early death Neg Hx   . Hearing loss Neg Hx   . Heart disease Neg Hx   . Hyperlipidemia Neg Hx   . Hypertension Neg Hx   . Kidney disease Neg Hx   . Learning disabilities Neg Hx   . Mental illness Neg Hx   . Mental retardation Neg Hx   . Miscarriages / Stillbirths Neg Hx   . Stroke Neg Hx   . Vision loss Neg Hx     Social History Social History  Substance Use Topics  . Smoking status: Never Smoker  . Smokeless tobacco: Never Used  . Alcohol use No     Allergies   Patient has no known allergies.   Review of Systems Review of Systems  Constitutional: Negative for chills and fever.  Gastrointestinal:  Positive for abdominal pain. Negative for diarrhea, nausea and vomiting.  Genitourinary: Positive for vaginal discharge. Negative for difficulty urinating, dysuria, hematuria, vaginal bleeding and vaginal pain.  All other systems reviewed and are negative.    Physical Exam Updated Vital Signs BP (!) 110/46 (BP Location: Left Arm)   Pulse 79   Temp 98.4 F (36.9 C) (Oral)   Resp 17   Ht 5' 5.35" (1.66 m)   Wt 138 lb 14.2 oz (63 kg)   LMP 06/25/2016   SpO2 100%   BMI 22.86 kg/m   Physical Exam  Constitutional: She is oriented to person, place, and time. She appears well-developed and well-nourished.  HENT:  Head: Normocephalic and atraumatic.  Eyes: Pupils are equal, round, and reactive to light. EOM are normal.  Neck:  Normal range of motion. Neck supple. No JVD present.  Cardiovascular: Normal rate, regular rhythm and normal heart sounds.   No murmur heard. Pulmonary/Chest: Effort normal and breath sounds normal. She has no wheezes. She has no rales. She exhibits no tenderness.  Abdominal: Soft. Bowel sounds are normal. She exhibits no distension and no mass. There is no tenderness.  Genitourinary:  Genitourinary Comments: Normal external female genitalia. Cervix is closed. Very small amount of blood present in the vaginal vault. No adnexal masses. Fundus approximately 6 weeks size and nontender.  Musculoskeletal: Normal range of motion. She exhibits no edema.  Lymphadenopathy:    She has no cervical adenopathy.  Neurological: She is alert and oriented to person, place, and time. No cranial nerve deficit. She exhibits normal muscle tone. Coordination normal.  Skin: Skin is warm and dry. No rash noted.  Psychiatric: She has a normal mood and affect. Her behavior is normal. Judgment and thought content normal.  Nursing note and vitals reviewed.    ED Treatments / Results  DIAGNOSTIC STUDIES: Oxygen Saturation is 100% on RA, normal by my interpretation.    COORDINATION OF CARE:  7:41 PM Discussed treatment plan with pt at bedside and pt agreed to plan, which includes a UA and ultrasound.   Labs (all labs ordered are listed, but only abnormal results are displayed) Labs Reviewed  WET PREP, GENITAL - Abnormal; Notable for the following:       Result Value   Clue Cells Wet Prep HPF POC PRESENT (*)    WBC, Wet Prep HPF POC MANY (*)    All other components within normal limits  PREGNANCY, URINE - Abnormal; Notable for the following:    Preg Test, Ur POSITIVE (*)    All other components within normal limits  URINALYSIS, ROUTINE W REFLEX MICROSCOPIC - Abnormal; Notable for the following:    Hgb urine dipstick TRACE (*)    All other components within normal limits  URINALYSIS, MICROSCOPIC (REFLEX) -  Abnormal; Notable for the following:    Bacteria, UA RARE (*)    Squamous Epithelial / LPF 6-30 (*)    All other components within normal limits  BASIC METABOLIC PANEL - Abnormal; Notable for the following:    Glucose, Bld 102 (*)    All other components within normal limits  CBC WITH DIFFERENTIAL/PLATELET - Abnormal; Notable for the following:    RBC 3.84 (*)    HCT 34.2 (*)    All other components within normal limits  HCG, QUANTITATIVE, PREGNANCY - Abnormal; Notable for the following:    hCG, Beta Chain, Quant, S 40,98153,722 (*)    All other components within normal limits  RPR  HIV ANTIBODY (ROUTINE  TESTING)  ABO/RH  GC/CHLAMYDIA PROBE AMP () NOT AT [facility]   Radiology US Ob Comp Less 14 Wks  Result Date: 08/08/2016 CLINICAL DATA:  Pelvic pain and cramping. Vaginal bleeding for 1 week. Pregnant patient. Last menstrual period 06/25/2016. Patient is 6 weeks and 2 days pregnant based on the last menstrual period. Beta HCG level is 53,722 EXAM: OBSTETRIC <14 WK [place] AND TRANSVAGINAL OB US TECHNIQUE: Both transabdominal and transvaginal ultrasound examinations were performed for complete evaluation of the gestation as well as the maternal uterus, adnexal regions, and pelvic cul-de-sac. Transvaginal technique was performed to assess early pregnancy. COMPARISON:  None. FINDINGS: Intrauterine gestational sac: Single Yolk sac:  Visualized. Embryo:  Visualized. Cardiac Activity: Visualized. Heart Rate: 90  bpm CRL:  2.9  mm   5 w   6 d                  [place] EDC: 04/04/2017 Subchorionic hemorrhage:  None visualized. Maternal uterus/adnexae: No uterine masses. Ovaries are unremarkable. No adnexal masses. No pelvic free fluid. IMPRESSION: 1. Single live intrauterine pregnancy with a measured gestational age of [redacted] weeks and 6 days, consistent with the expected gestational age based on last menstrual period. 2. No emergent pregnancy complication. Electronically Signed   By: Amie Portland M.D.   On:  08/08/2016 21:54   US Ob Transvaginal  Result Date: 08/08/2016 CLINICAL DATA:  Pelvic pain and cramping. Vaginal bleeding for 1 week. Pregnant patient. Last menstrual period 06/25/2016. Patient is 6 weeks and 2 days pregnant based on the last menstrual period. Beta HCG level is 53,722 EXAM: OBSTETRIC <14 WK [place] AND TRANSVAGINAL OB US TECHNIQUE: Both transabdominal and transvaginal ultrasound examinations were performed for complete evaluation of the gestation as well as the maternal uterus, adnexal regions, and pelvic cul-de-sac. Transvaginal technique was performed to assess early pregnancy. COMPARISON:  None. FINDINGS: Intrauterine gestational sac: Single Yolk sac:  Visualized. Embryo:  Visualized. Cardiac Activity: Visualized. Heart Rate: 90  bpm CRL:  2.9  mm   5 w   6 d                  [place] EDC: 04/04/2017 Subchorionic hemorrhage:  None visualized. Maternal uterus/adnexae: No uterine masses. Ovaries are unremarkable. No adnexal masses. No pelvic free fluid. IMPRESSION: 1. Single live intrauterine pregnancy with a measured gestational age of [redacted] weeks and 6 days, consistent with the expected gestational age based on last menstrual period. 2. No emergent pregnancy complication. Electronically Signed   By: Amie Portland M.D.   On: 08/08/2016 21:54    Procedures Procedures (including critical care time)  Medications Ordered in ED Medications - No data to display   Initial Impression / Assessment and Plan / ED Course  I have reviewed the triage vital signs and the nursing notes.  Pertinent labs & imaging results that were available during my care of the patient were reviewed by me and considered in my medical decision making (see chart for details).  Pelvic pain in pregnancy. Exam is unremarkable. She is sent for ultrasound which shows intrauterine pregnancy with gestational age per ultrasound consistent with her history. Blood type O+, so not a Rho Gam candidate. Wet prep does show presence of clue  cells with many bacteria, so she is given a prescription for metronidazole vaginal gel. Also given prescription for prenatal vitamins. She has an appointment with her obstetrician established for next week, and she is to keep that appointment.  Final Clinical Impressions(s) /  ED Diagnoses   Final diagnoses:  Pelvic pain in pregnancy, antepartum, first trimester  BV (bacterial vaginosis)    New Prescriptions New Prescriptions   METRONIDAZOLE (METROGEL VAGINAL) 0.75 % VAGINAL GEL    Place 1 Applicatorful vaginally 2 (two) times daily.   PRENATAL VITAMIN W/FE, FA (NATACHEW) 29-1 MG CHEW CHEWABLE TABLET    Chew 1 tablet by mouth daily at 12 noon.   I personally performed the services described in this documentation, which was scribed in my presence. The recorded information has been reviewed and is accurate.       Dione Booze, MD 08/08/16 [phone]

## 2016-08-08 NOTE — ED Triage Notes (Signed)
Pt speaks only Congohinese and is with teenage daughter. Pt c/o dull lower abd pain x1 week. Pt denies N/V/D. Pt reports she just found out she is pregnant and has an appointment for first OB/GYN visit. Pt reports "coffee" colored d/c that is intermittent.   [employer] used: Garfield CorneaGwyn [phone]

## 2016-08-08 NOTE — ED Notes (Signed)
Pt to US.

## 2016-08-08 NOTE — Discharge Instructions (Signed)
You may take acetaminophen as needed for pain. Follow up with your obstetrician - keep your scheduled appointment.

## 2016-08-09 LAB — GC/CHLAMYDIA PROBE AMP (~~LOC~~) NOT AT ARMC
Chlamydia: NEGATIVE
Neisseria Gonorrhea: NEGATIVE

## 2016-08-10 LAB — RPR: RPR Ser Ql: NONREACTIVE

## 2016-08-13 LAB — HIV ANTIBODY (ROUTINE TESTING W REFLEX): HIV SCREEN 4TH GENERATION: NONREACTIVE

## 2016-09-09 ENCOUNTER — Encounter (HOSPITAL_BASED_OUTPATIENT_CLINIC_OR_DEPARTMENT_OTHER): Payer: Self-pay | Admitting: *Deleted

## 2016-09-09 NOTE — Anesthesia Preprocedure Evaluation (Addendum)
Anesthesia Evaluation  Patient identified by MRN, date of birth, ID band Patient awake    Reviewed: Allergy & Precautions, NPO status , Patient's Chart, lab work & pertinent test results  Airway Mallampati: I  TM Distance: >3 FB Neck ROM: Full    Dental  (+) Teeth Intact, Dental Advisory Given   Pulmonary neg pulmonary ROS,    breath sounds clear to auscultation       Cardiovascular negative cardio ROS   Rhythm:Regular Rate:Normal     Neuro/Psych negative neurological ROS     GI/Hepatic negative GI ROS, Neg liver ROS,   Endo/Other  negative endocrine ROS  Renal/GU negative Renal ROS     Musculoskeletal negative musculoskeletal ROS (+)   Abdominal   Peds  Hematology negative hematology ROS (+)   Anesthesia Other Findings Day of surgery medications reviewed with the patient.  Reproductive/Obstetrics                            Anesthesia Physical Anesthesia Plan  ASA: II  Anesthesia Plan: General   Post-op Pain Management:    Induction: Intravenous  PONV Risk Score and Plan: 4 or greater and Ondansetron, Dexamethasone, Midazolam, Scopolamine patch - Pre-op and Propofol infusion  Airway Management Planned: LMA  Additional Equipment:   Intra-op Plan:   Post-operative Plan: Extubation in OR  Informed Consent: I have reviewed the patients History and Physical, chart, labs and discussed the procedure including the risks, benefits and alternatives for the proposed anesthesia with the patient or authorized representative who has indicated his/her understanding and acceptance.   Dental advisory given  Plan Discussed with: CRNA  Anesthesia Plan Comments:         Anesthesia Quick Evaluation

## 2016-09-09 NOTE — Progress Notes (Addendum)
SPOKE W/ PT [facility] PACIFIC INTERPRETER [phone].  NPO AFTER MN. ARRIVE AT 0600.  NEEDS CBC AND T&S.  PER PT REQUESTED FEMALE MADADRIN INTERPRETER,  REQUEST SENT AND WILL PLACE CONFIRMATION ON CHART, TO ARRIVE AT 0347.

## 2016-09-09 NOTE — H&P (Signed)
Sharon Powers is an 41 y.o. female  [phone] presenting for a suction dilation and evacuation for missed miscarriage.  She has had no symptoms of bleeding or cramping.  The miscarriage was found on routine [place] at her OB w/u appointment.  CRL 8+ weeks with no FHM.  She speaks Mandarin.    Pertinent Gynecological History: none  OB History:  NSVD x 2   Menstrual History:  Patient's last menstrual period was 06/25/2016.    Past Medical History:  Diagnosis Date  . Language barrier    Mandarin [place]  . No pertinent past medical history     Past Surgical History:  Procedure Laterality Date  . NO PAST SURGERIES      Family History  Problem Relation Age of Onset  . Alcohol abuse Neg Hx   . Arthritis Neg Hx   . Asthma Neg Hx   . Birth defects Neg Hx   . Cancer Neg Hx   . COPD Neg Hx   . Depression Neg Hx   . Diabetes Neg Hx   . Drug abuse Neg Hx   . Early death Neg Hx   . Hearing loss Neg Hx   . Heart disease Neg Hx   . Hyperlipidemia Neg Hx   . Hypertension Neg Hx   . Kidney disease Neg Hx   . Learning disabilities Neg Hx   . Mental illness Neg Hx   . Mental retardation Neg Hx   . Miscarriages / Stillbirths Neg Hx   . Stroke Neg Hx   . Vision loss Neg Hx     Social History:  reports that she has never smoked. She has never used smokeless tobacco. She reports that she does not drink alcohol or use drugs.  Allergies: No Known Allergies  No prescriptions prior to admission.    Review of Systems  Cardiovascular: Negative for chest pain.  Gastrointestinal: Negative for abdominal pain.    Last menstrual period 06/25/2016. Physical Exam  Constitutional: She appears well-developed and well-nourished.  Cardiovascular: Normal rate and regular rhythm.   Respiratory: Effort normal.  GI: Soft.  Genitourinary: Vagina normal.  Genitourinary Comments: Uterus 8 weeks  Neurological: She is alert.  Psychiatric: She has a normal mood and affect.    No results found for this  or any previous visit (from the past 24 hour(s)).  No results found.  Assessment/Plan: A mandarin translator line was used to d/w pt. The findings of missed abortion were discussed with the patient in detail. We discussed her options going forward with expectant management, cytotec induced bleeding, and D&C. The risks and benefits of each approach were reviewed and the patient elects to schedule a D&C. The risks of the procedure were reviewed with her in detail including bleeding, infection, and possible uterine perforation. She will be prescribed of cytotec to moisten and insert vaginally 3 hours prior to the procedure to aid with cervical dilation. Her blood type will be confirmed at the hospital and the procedure scheduled.  Oliver Pila 09/09/2016, 10:02 AM

## 2016-09-10 ENCOUNTER — Ambulatory Visit (HOSPITAL_BASED_OUTPATIENT_CLINIC_OR_DEPARTMENT_OTHER)
Admission: RE | Admit: 2016-09-10 | Discharge: 2016-09-10 | Disposition: A | Discharge status: Home / Self Care | Payer: BLUE CROSS/BLUE SHIELD | Source: Ambulatory Visit | Attending: Obstetrics and Gynecology | Admitting: Obstetrics and Gynecology

## 2016-09-10 ENCOUNTER — Encounter (HOSPITAL_BASED_OUTPATIENT_CLINIC_OR_DEPARTMENT_OTHER)
Admission: RE | Disposition: A | Discharge status: Home / Self Care | Payer: Self-pay | Source: Ambulatory Visit | Attending: Obstetrics and Gynecology

## 2016-09-10 ENCOUNTER — Ambulatory Visit (HOSPITAL_BASED_OUTPATIENT_CLINIC_OR_DEPARTMENT_OTHER): Payer: BLUE CROSS/BLUE SHIELD | Admitting: Anesthesiology

## 2016-09-10 ENCOUNTER — Encounter (HOSPITAL_BASED_OUTPATIENT_CLINIC_OR_DEPARTMENT_OTHER): Payer: Self-pay

## 2016-09-10 DIAGNOSIS — O021 Missed abortion: Secondary | ICD-10-CM | POA: Diagnosis present

## 2016-09-10 DIAGNOSIS — Z3A08 8 weeks gestation of pregnancy: Secondary | ICD-10-CM | POA: Insufficient documentation

## 2016-09-10 HISTORY — PX: DILATION AND CURETTAGE OF UTERUS: SHX78

## 2016-09-10 HISTORY — DX: Complete or unspecified spontaneous abortion without complication: O03.9

## 2016-09-10 LAB — CBC
HCT: 34 % — ABNORMAL LOW (ref 36.0–46.0)
HEMOGLOBIN: 11.9 g/dL — AB (ref 12.0–15.0)
MCH: 31 pg (ref 26.0–34.0)
MCHC: 35 g/dL (ref 30.0–36.0)
MCV: 88.5 fL (ref 78.0–100.0)
PLATELETS: 174 10*3/uL (ref 150–400)
RBC: 3.84 MIL/uL — AB (ref 3.87–5.11)
RDW: 12.8 % (ref 11.5–15.5)
WBC: 7 10*3/uL (ref 4.0–10.5)

## 2016-09-10 LAB — ABO/RH: ABO/RH(D): O POS

## 2016-09-10 LAB — TYPE AND SCREEN
ABO/RH(D): O POS
ANTIBODY SCREEN: NEGATIVE

## 2016-09-10 SURGERY — DILATION AND CURETTAGE
Anesthesia: General

## 2016-09-10 MED ORDER — ONDANSETRON HCL 4 MG/2ML IJ SOLN
INTRAMUSCULAR | Status: AC
  Filled 2016-09-10: qty 2

## 2016-09-10 MED ORDER — PROPOFOL 10 MG/ML IV BOLUS
INTRAVENOUS | Status: AC
  Filled 2016-09-10: qty 40

## 2016-09-10 MED ORDER — FENTANYL CITRATE (PF) 100 MCG/2ML IJ SOLN
INTRAMUSCULAR | Status: DC | PRN
  Administered 2016-09-10: 50 ug via INTRAVENOUS

## 2016-09-10 MED ORDER — KETOROLAC TROMETHAMINE 30 MG/ML IJ SOLN
INTRAMUSCULAR | Status: DC | PRN
  Administered 2016-09-10: 30 mg via INTRAVENOUS

## 2016-09-10 MED ORDER — LIDOCAINE 2% (20 MG/ML) 5 ML SYRINGE
INTRAMUSCULAR | Status: AC
  Filled 2016-09-10: qty 5

## 2016-09-10 MED ORDER — PROMETHAZINE HCL 25 MG/ML IJ SOLN
6.2500 mg | INTRAMUSCULAR | Status: DC | PRN
  Filled 2016-09-10: qty 1

## 2016-09-10 MED ORDER — IBUPROFEN 200 MG PO TABS: 400.0000 mg | ORAL_TABLET | ORAL | 0 refills | Status: DC | PRN

## 2016-09-10 MED ORDER — FENTANYL CITRATE (PF) 100 MCG/2ML IJ SOLN
INTRAMUSCULAR | Status: AC
  Filled 2016-09-10: qty 2

## 2016-09-10 MED ORDER — DEXAMETHASONE SODIUM PHOSPHATE 10 MG/ML IJ SOLN
INTRAMUSCULAR | Status: AC
  Filled 2016-09-10: qty 1

## 2016-09-10 MED ORDER — SILVER NITRATE-POT NITRATE 75-25 % EX MISC
CUTANEOUS | Status: DC | PRN
  Administered 2016-09-10: 1

## 2016-09-10 MED ORDER — EPHEDRINE SULFATE-NACL 50-0.9 MG/10ML-% IV SOSY
PREFILLED_SYRINGE | INTRAVENOUS | Status: DC | PRN
  Administered 2016-09-10: 10 mg via INTRAVENOUS

## 2016-09-10 MED ORDER — LACTATED RINGERS IV SOLN
INTRAVENOUS | Status: DC
  Administered 2016-09-10 (×2): via INTRAVENOUS
  Filled 2016-09-10: qty 1000

## 2016-09-10 MED ORDER — KETOROLAC TROMETHAMINE 30 MG/ML IJ SOLN
INTRAMUSCULAR | Status: AC
  Filled 2016-09-10: qty 1

## 2016-09-10 MED ORDER — MEPERIDINE HCL 25 MG/ML IJ SOLN
6.2500 mg | INTRAMUSCULAR | Status: DC | PRN
  Filled 2016-09-10: qty 1

## 2016-09-10 MED ORDER — LIDOCAINE HCL 1 % IJ SOLN
INTRAMUSCULAR | Status: DC | PRN
  Administered 2016-09-10: 20 mL

## 2016-09-10 MED ORDER — PROPOFOL 10 MG/ML IV BOLUS
INTRAVENOUS | Status: DC | PRN
  Administered 2016-09-10: 150 mg via INTRAVENOUS

## 2016-09-10 MED ORDER — LACTATED RINGERS IV SOLN
INTRAVENOUS | Status: DC
  Filled 2016-09-10: qty 1000

## 2016-09-10 MED ORDER — EPHEDRINE 5 MG/ML INJ
INTRAVENOUS | Status: AC
  Filled 2016-09-10: qty 10

## 2016-09-10 MED ORDER — ONDANSETRON HCL 4 MG/2ML IJ SOLN
INTRAMUSCULAR | Status: DC | PRN
  Administered 2016-09-10: 4 mg via INTRAVENOUS

## 2016-09-10 MED ORDER — PHENYLEPHRINE 40 MCG/ML (10ML) SYRINGE FOR IV PUSH (FOR BLOOD PRESSURE SUPPORT)
PREFILLED_SYRINGE | INTRAVENOUS | Status: AC
  Filled 2016-09-10: qty 10

## 2016-09-10 MED ORDER — FENTANYL CITRATE (PF) 100 MCG/2ML IJ SOLN
25.0000 ug | INTRAMUSCULAR | Status: DC | PRN
  Filled 2016-09-10: qty 1

## 2016-09-10 MED ORDER — OXYCODONE HCL 5 MG PO TABS
5.0000 mg | ORAL_TABLET | Freq: Once | ORAL | Status: DC | PRN
  Filled 2016-09-10: qty 1

## 2016-09-10 MED ORDER — DEXAMETHASONE SODIUM PHOSPHATE 10 MG/ML IJ SOLN
INTRAMUSCULAR | Status: DC | PRN
  Administered 2016-09-10: 10 mg via INTRAVENOUS

## 2016-09-10 MED ORDER — OXYCODONE HCL 5 MG/5ML PO SOLN
5.0000 mg | Freq: Once | ORAL | Status: DC | PRN
  Filled 2016-09-10: qty 5

## 2016-09-10 MED ORDER — MIDAZOLAM HCL 2 MG/2ML IJ SOLN
INTRAMUSCULAR | Status: AC
  Filled 2016-09-10: qty 2

## 2016-09-10 MED ORDER — PHENYLEPHRINE 40 MCG/ML (10ML) SYRINGE FOR IV PUSH (FOR BLOOD PRESSURE SUPPORT)
PREFILLED_SYRINGE | INTRAVENOUS | Status: DC | PRN
  Administered 2016-09-10: 80 ug via INTRAVENOUS

## 2016-09-10 MED ORDER — MIDAZOLAM HCL 5 MG/5ML IJ SOLN
INTRAMUSCULAR | Status: DC | PRN
  Administered 2016-09-10: 2 mg via INTRAVENOUS

## 2016-09-10 MED ORDER — LIDOCAINE 2% (20 MG/ML) 5 ML SYRINGE
INTRAMUSCULAR | Status: DC | PRN
  Administered 2016-09-10: 60 mg via INTRAVENOUS

## 2016-09-10 SURGICAL SUPPLY — 17 items
CLOTH BEACON ORANGE TIMEOUT ST (SAFETY) ×3 IMPLANT
FILTER UTR ASPR ASSEMBLY (MISCELLANEOUS) ×3 IMPLANT
GLOVE BIO SURGEON STRL SZ 6.5 (GLOVE) ×2 IMPLANT
GLOVE BIO SURGEONS STRL SZ 6.5 (GLOVE) ×1
GOWN STRL REUS W/ TWL LRG LVL3 (GOWN DISPOSABLE) ×1 IMPLANT
GOWN STRL REUS W/TWL LRG LVL3 (GOWN DISPOSABLE) ×2
HOSE CONNECTING 18IN BERKELEY (TUBING) ×3 IMPLANT
KIT BERKELEY 1ST TRI 3/8 NO TR (MISCELLANEOUS) ×3 IMPLANT
KIT RM TURNOVER CYSTO AR (KITS) ×3 IMPLANT
PACK VAGINAL MINOR WOMEN LF (CUSTOM PROCEDURE TRAY) ×3 IMPLANT
PAD OB MATERNITY 4.3X12.25 (PERSONAL CARE ITEMS) ×3 IMPLANT
PAD PREP 24X48 CUFFED NSTRL (MISCELLANEOUS) ×3 IMPLANT
SET BERKELEY SUCTION TUBING (SUCTIONS) ×3 IMPLANT
TOP DISP BERKELEY (MISCELLANEOUS) IMPLANT
TOWEL OR 17X24 6PK STRL BLUE (TOWEL DISPOSABLE) ×3 IMPLANT
VACURETTE 7MM CVD STRL WRAP (CANNULA) IMPLANT
WATER STERILE IRR 500ML POUR (IV SOLUTION) ×3 IMPLANT

## 2016-09-10 NOTE — Progress Notes (Signed)
Patient ID: Sharon Powers, female   DOB: 06-Nov-1975, 41 y.o.   MRN: [id number] Per patient no changes in dictated H&P.  Brief exam WNL.  Went over entire procedure again with translator interpreting

## 2016-09-10 NOTE — Discharge Instructions (Signed)
° °  D & C Home care Instructions:   Personal hygiene:  Use sanitary napkins for vaginal drainage not tampons. Shower or tub bathe the day after your procedure. No douching until bleeding stops. Always wipe from front to back after  Elimination.  Activity: Do not drive or operate any equipment today. The effects of the anesthesia are still present and drowsiness may result. Rest today, not necessarily flat bed rest, just take it easy. You may resume your normal activity in one to 2 days.  Sexual activity: No intercourse for 2 weeks or as indicated by your physician  Diet: Eat a light diet as desired this evening. You may resume a regular diet tomorrow.  Return to work: One to 2 days.  General Expectations of your surgery: Vaginal bleeding should be no heavier than a normal period. Spotting may continue up to 10 days. Mild cramps may continue for a couple of days. You may have a regular period in 4-6 weeks.  Unexpected observations call your doctor if these occur: persistent or heavy bleeding. Severe abdominal cramping or pain. Elevation of temperature greater than 100F.  Call for an appointment in 2 weeks.    Post Anesthesia Home Care Instructions   Activity: Get plenty of rest for the remainder of the day. A responsible individual must stay with you for 24 hours following the procedure.  For the next 24 hours, DO NOT: -Drive a car -Advertising copywriter -Drink alcoholic beverages -Take any medication unless instructed by your physician -Make any legal decisions or sign important papers.  Meals: Start with liquid foods such as gelatin or soup. Progress to regular foods as tolerated. Avoid greasy, spicy, heavy foods. If nausea and/or vomiting occur, drink only clear liquids until the nausea and/or vomiting subsides. Call your physician if vomiting continues.  Special Instructions/Symptoms: Your throat may feel dry or sore from the anesthesia or the breathing tube placed in your throat  during surgery. If this causes discomfort, gargle with warm salt water. The discomfort should disappear within 24 hours.  If you had a scopolamine patch placed behind your ear for the management of post- operative nausea and/or vomiting:  1. The medication in the patch is effective for 72 hours, after which it should be removed.  Wrap patch in a tissue and discard in the trash. Wash hands thoroughly with soap and water. 2. You may remove the patch earlier than 72 hours if you experience unpleasant side effects which may include dry mouth, dizziness or visual disturbances. 3. Avoid touching the patch. Wash your hands with soap and water after contact with the patch.

## 2016-09-10 NOTE — Anesthesia Postprocedure Evaluation (Signed)
Anesthesia Post Note  Patient: Sharon Powers  Procedure(s) Performed: Procedure(s) (LRB): DILATATION AND CURETTAGE (N/A)     Patient location during evaluation: PACU Anesthesia Type: General Level of consciousness: awake and alert Pain management: pain level controlled Vital Signs Assessment: post-procedure vital signs reviewed and stable Respiratory status: spontaneous breathing, nonlabored ventilation, respiratory function stable and patient connected to nasal cannula oxygen Cardiovascular status: blood pressure returned to baseline and stable Postop Assessment: no signs of nausea or vomiting Anesthetic complications: no    Last Vitals:  Vitals:   09/10/16 0845 09/10/16 0945  BP: 122/69 (!) 120/57  Pulse: 67 68  Resp: 16 16  Temp:  37.3 C  SpO2: 100% 100%    Last Pain:  Vitals:   09/10/16 0945  TempSrc: Oral  PainSc:                  Shelton Silvas

## 2016-09-10 NOTE — Anesthesia Procedure Notes (Signed)
Procedure Name: LMA Insertion Date/Time: 09/10/2016 7:28 AM Performed by: Maris Berger T Pre-anesthesia Checklist: Patient identified, Emergency Drugs available, Suction available and Patient being monitored Patient Re-evaluated:Patient Re-evaluated prior to induction Oxygen Delivery Method: Circle system utilized Preoxygenation: Pre-oxygenation with 100% oxygen Induction Type: IV induction Ventilation: Mask ventilation without difficulty LMA: LMA inserted LMA Size: 4.0 Number of attempts: 1 Airway Equipment and Method: Bite block Placement Confirmation: positive ETCO2 Tube secured with: Tape Dental Injury: Teeth and Oropharynx as per pre-operative assessment

## 2016-09-10 NOTE — Transfer of Care (Signed)
Immediate Anesthesia Transfer of Care Note  Patient: Sharon Powers  Procedure(s) Performed: Procedure(s): DILATATION AND CURETTAGE (N/A)  Patient Location: PACU  Anesthesia Type:General  Level of Consciousness: sedated  Airway & Oxygen Therapy: Patient Spontanous Breathing and Patient connected to nasal cannula oxygen  Post-op Assessment: Report given to RN  Post vital signs: Reviewed and stable  Last Vitals: 108/51, 71, 11, 100%, 97.9 Vitals:   09/10/16 0604 09/10/16 0755  BP: (!) 110/55   Pulse: 78   Resp: 14   Temp: 37 C 36.6 C  SpO2: 100%     Last Pain:  Vitals:   09/10/16 0604  TempSrc: Oral      Patients Stated Pain Goal: 4 (09/10/16 0616)  Complications: No apparent anesthesia complications

## 2016-09-10 NOTE — Op Note (Signed)
Operative Note    Preoperative Diagnosis Missed miscarriage at 8 weeks  Postoperative Diagnosis Same  Procedure Suction Dilation and Evacuation  Surgeon Huel Cote, MD  Anesthesia LMA and 1% lidocaine  local cervical block  Fluids: EBL 75mL UOP 86mL straight cath prior IVF  Findings Uterus 9-10 weeks size, moderate amount POC's  Specimen Products of Conception  Procedure Note Pt was taken to operating room where LMA anesthesia was obtained without difficulty.  She was prepped and draped in the normal sterile fashion in the dorsal lithotomy position.  An appropriate timeout was performed.  A speculum was placed in the vagina and 2cc of 1% plain lidocaine injected into the anterior cervix.  An additional 9cc was placed both at 2 and 10 o'clock for a paracervical block.  A single tooth tenaculum was placed on the anterior lip of the cervix. The cervix was slightly dilated from the pt's cytotec treatment and  sound was easily introduced into the uterus and the uterus sounded to about 8cm.  The 46mm suction currette was obtained and introduced into the fundus.  With several passes, a moderate amount of tissue was obtained.  When no further tissue was noted, the suction was discontinued and a gentle currettage performed.  One additional pass revealed no further tissue, and the procedure was completed.  The tenaculum was removed and silver nitrate used for hemostasis.  All instruments and sponges were removed from vagina and the patient awakened and taken to the PACU in good condition.

## 2016-09-11 ENCOUNTER — Encounter (HOSPITAL_BASED_OUTPATIENT_CLINIC_OR_DEPARTMENT_OTHER): Payer: Self-pay | Admitting: Obstetrics and Gynecology

## 2016-09-21 DIAGNOSIS — Z9889 Other specified postprocedural states: Secondary | ICD-10-CM

## 2017-03-21 ENCOUNTER — Encounter (HOSPITAL_BASED_OUTPATIENT_CLINIC_OR_DEPARTMENT_OTHER): Payer: Self-pay | Admitting: Emergency Medicine

## 2017-03-21 ENCOUNTER — Emergency Department (HOSPITAL_BASED_OUTPATIENT_CLINIC_OR_DEPARTMENT_OTHER)
Admission: EM | Admit: 2017-03-21 | Discharge: 2017-03-21 | Disposition: A | Discharge status: Home / Self Care | Payer: Self-pay | Attending: Emergency Medicine | Admitting: Emergency Medicine

## 2017-03-21 ENCOUNTER — Other Ambulatory Visit: Payer: Self-pay

## 2017-03-21 ENCOUNTER — Emergency Department (HOSPITAL_BASED_OUTPATIENT_CLINIC_OR_DEPARTMENT_OTHER): Payer: Self-pay

## 2017-03-21 DIAGNOSIS — Z79899 Other long term (current) drug therapy: Secondary | ICD-10-CM | POA: Insufficient documentation

## 2017-03-21 DIAGNOSIS — N939 Abnormal uterine and vaginal bleeding, unspecified: Secondary | ICD-10-CM

## 2017-03-21 DIAGNOSIS — N938 Other specified abnormal uterine and vaginal bleeding: Secondary | ICD-10-CM | POA: Insufficient documentation

## 2017-03-21 DIAGNOSIS — N8500 Endometrial hyperplasia, unspecified: Secondary | ICD-10-CM | POA: Insufficient documentation

## 2017-03-21 LAB — URINALYSIS, ROUTINE W REFLEX MICROSCOPIC
Bilirubin Urine: NEGATIVE
GLUCOSE, UA: NEGATIVE mg/dL
KETONES UR: NEGATIVE mg/dL
LEUKOCYTES UA: NEGATIVE
Nitrite: NEGATIVE
PH: 6.5 (ref 5.0–8.0)
Protein, ur: 30 mg/dL — AB
Specific Gravity, Urine: <1.005 — ABNORMAL LOW (ref 1.005–1.030)

## 2017-03-21 LAB — CBC WITH DIFFERENTIAL/PLATELET
BASOS ABS: 0 10*3/uL (ref 0.0–0.1)
BASOS PCT: 0 %
Eosinophils Absolute: 0 10*3/uL (ref 0.0–0.7)
Eosinophils Relative: 1 %
HCT: 36 % (ref 36.0–46.0)
HEMOGLOBIN: 11.8 g/dL — AB (ref 12.0–15.0)
Lymphocytes Relative: 44 %
Lymphs Abs: 1.4 10*3/uL (ref 0.7–4.0)
MCH: 30.3 pg (ref 26.0–34.0)
MCHC: 32.8 g/dL (ref 30.0–36.0)
MCV: 92.5 fL (ref 78.0–100.0)
Monocytes Absolute: 0.2 10*3/uL (ref 0.1–1.0)
Monocytes Relative: 8 %
NEUTROS PCT: 47 %
Neutro Abs: 1.5 10*3/uL — ABNORMAL LOW (ref 1.7–7.7)
Platelets: 141 10*3/uL — ABNORMAL LOW (ref 150–400)
RBC: 3.89 MIL/uL (ref 3.87–5.11)
RDW: 12.1 % (ref 11.5–15.5)
WBC: 3.2 10*3/uL — AB (ref 4.0–10.5)

## 2017-03-21 LAB — COMPREHENSIVE METABOLIC PANEL
ALBUMIN: 3.9 g/dL (ref 3.5–5.0)
ALK PHOS: 48 U/L (ref 38–126)
ALT: 12 U/L — AB (ref 14–54)
ANION GAP: 6 (ref 5–15)
AST: 19 U/L (ref 15–41)
BUN: 10 mg/dL (ref 6–20)
CALCIUM: 8.3 mg/dL — AB (ref 8.9–10.3)
CO2: 28 mmol/L (ref 22–32)
Chloride: 105 mmol/L (ref 101–111)
Creatinine, Ser: 0.48 mg/dL (ref 0.44–1.00)
GFR calc Af Amer: >60 mL/min (ref >60)
GFR calc non Af Amer: >60 mL/min (ref >60)
GLUCOSE: 95 mg/dL (ref 65–99)
Potassium: 3.6 mmol/L (ref 3.5–5.1)
SODIUM: 139 mmol/L (ref 135–145)
Total Bilirubin: 0.3 mg/dL (ref 0.3–1.2)
Total Protein: 7.2 g/dL (ref 6.5–8.1)

## 2017-03-21 LAB — URINALYSIS, MICROSCOPIC (REFLEX): Squamous Epithelial / LPF: NONE SEEN

## 2017-03-21 LAB — PREGNANCY, URINE: Preg Test, Ur: NEGATIVE

## 2017-03-21 MED ORDER — SODIUM CHLORIDE 0.9 % IV BOLUS (SEPSIS)
1000.0000 mL | Freq: Once | INTRAVENOUS | Status: AC
  Administered 2017-03-21: 1000 mL via INTRAVENOUS

## 2017-03-21 NOTE — ED Triage Notes (Addendum)
Mandarin interpreter [phone].  Patient reports menstrual bleeding x 20 days.  Reports blood clots as well.  Reports that menstrual cycles have been irregular since D and C in August.  Denies abdominal pain.  Reports dizziness, lightheadedness.  Denies fevers, dysuria. Reports currently taking amoxicillin for the past 2-3 days she bought from [place] due to continuous period.

## 2017-03-21 NOTE — ED Provider Notes (Signed)
[facility] EMERGENCY DEPARTMENT Provider Note   CSN: [id number] Arrival date & time: 03/21/17  4098     History   Chief Complaint Chief Complaint  Patient presents with  . Vaginal Bleeding    HPI Sharon Powers is a 42 y.o. female status post D&C in August last year, here presenting with persistent vaginal bleeding, and lightheadedness and dizziness.  Patient states that she has been having vaginal bleeding for the last 3 weeks.  States that initially it was some spotting and then since yesterday bleeding was worse so she changed about 3-4 pads.  Has some diffuse lower abdominal cramps but denies any vaginal discharge or fevers.  Patient called a family friend and was prescribed Sprintec and took 1 dose with no relief.  Patient has a history of regular menstrual cycles.  The history is provided by the patient. A language interpreter was used.    Past Medical History:  Diagnosis Date  . Language barrier    Mandarin [place]  . Miscarriage     Patient Active Problem List   Diagnosis Date Noted  . Normochromic normocytic anemia 04/22/2016  . Thrombocytopenia (HCC) 04/22/2016  . Pyelonephritis 04/21/2016    Past Surgical History:  Procedure Laterality Date  . DILATION AND CURETTAGE OF UTERUS N/A 09/10/2016   Procedure: DILATATION AND CURETTAGE;  Surgeon: Huel Cote, MD;  Location: [facility];  Service: Gynecology;  Laterality: N/A;  . NO PAST SURGERIES      OB History    Gravida Para Term Preterm AB Living   3 2 2  0 0 2   SAB TAB Ectopic Multiple Live Births   0 0 0 0 2       Home Medications    Prior to Admission medications   Medication Sig Start Date End Date Taking? Authorizing Provider  ibuprofen (ADVIL) 200 MG tablet Take 2 tablets (400 mg total) by mouth every 4 (four) hours as needed. 09/10/16   Huel Cote, MD    Family History Family History  Problem Relation Age of Onset  . Alcohol abuse Neg Hx   . Arthritis Neg  Hx   . Asthma Neg Hx   . Birth defects Neg Hx   . Cancer Neg Hx   . COPD Neg Hx   . Depression Neg Hx   . Diabetes Neg Hx   . Drug abuse Neg Hx   . Early death Neg Hx   . Hearing loss Neg Hx   . Heart disease Neg Hx   . Hyperlipidemia Neg Hx   . Hypertension Neg Hx   . Kidney disease Neg Hx   . Learning disabilities Neg Hx   . Mental illness Neg Hx   . Mental retardation Neg Hx   . Miscarriages / Stillbirths Neg Hx   . Stroke Neg Hx   . Vision loss Neg Hx     Social History Social History   Tobacco Use  . Smoking status: Never Smoker  . Smokeless tobacco: Never Used  Substance Use Topics  . Alcohol use: No  . Drug use: No     Allergies   Patient has no known allergies.   Review of Systems Review of Systems  Genitourinary: Positive for vaginal bleeding.  All other systems reviewed and are negative.    Physical Exam Updated Vital Signs BP 102/76   Pulse (!) 54   Temp 98.3 F (36.8 C) (Oral)   Resp 16   Wt 61.3 kg (135 lb  2.3 oz)   LMP 03/21/2017   SpO2 100%   Breastfeeding? Unknown   BMI 22.49 kg/m   Physical Exam  Constitutional: She appears well-developed and well-nourished.  HENT:  Head: Normocephalic.  Eyes: Conjunctivae and EOM are normal. Pupils are equal, round, and reactive to light.  Neck: Normal range of motion. Neck supple.  Cardiovascular: Normal rate, regular rhythm and normal heart sounds.  Pulmonary/Chest: Effort normal and breath sounds normal. No respiratory distress.  Abdominal: Soft. Bowel sounds are normal. She exhibits no distension. There is no tenderness.  Genitourinary:  Genitourinary Comments: Deferred (patient refused)   Musculoskeletal: Normal range of motion.  Neurological: She is alert.  Skin: Skin is warm.  Psychiatric: She has a normal mood and affect.  Nursing note and vitals reviewed.    ED Treatments / Results  Labs (all labs ordered are listed, but only abnormal results are displayed) Labs Reviewed  CBC  WITH DIFFERENTIAL/PLATELET - Abnormal; Notable for the following components:      Result Value   WBC 3.2 (*)    Hemoglobin 11.8 (*)    Platelets 141 (*)    Neutro Abs 1.5 (*)    All other components within normal limits  COMPREHENSIVE METABOLIC PANEL - Abnormal; Notable for the following components:   Calcium 8.3 (*)    ALT 12 (*)    All other components within normal limits  URINALYSIS, ROUTINE W REFLEX MICROSCOPIC - Abnormal; Notable for the following components:   Color, Urine PINK (*)    Specific Gravity, Urine <1.005 (*)    Hgb urine dipstick LARGE (*)    Protein, ur 30 (*)    All other components within normal limits  URINALYSIS, MICROSCOPIC (REFLEX) - Abnormal; Notable for the following components:   Bacteria, UA MANY (*)    All other components within normal limits  PREGNANCY, URINE  GC/CHLAMYDIA PROBE AMP ([place]) NOT [facility]    EKG  EKG Interpretation None       Radiology [place] Transvaginal Non-ob  Result Date: 03/21/2017 CLINICAL DATA:  Pelvic pain and vaginal bleeding. EXAM: TRANSABDOMINAL AND TRANSVAGINAL ULTRASOUND OF PELVIS DOPPLER ULTRASOUND OF OVARIES TECHNIQUE: Both transabdominal and transvaginal ultrasound examinations of the pelvis were performed. Transabdominal technique was performed for global imaging of the pelvis including uterus, ovaries, adnexal regions, and pelvic cul-de-sac. It was necessary to proceed with endovaginal exam following the transabdominal exam to visualize the endometrium and ovaries. Color and duplex Doppler ultrasound was utilized to evaluate blood flow to the ovaries. COMPARISON:  CT abdomen pelvis dated April 21, 2016. FINDINGS: Uterus Measurements: 9.5 x 5.1 x 5.4 cm. No fibroids or other mass visualized. Endometrium Thickness: 12 mm. No focal abnormality visualized. Slightly heterogeneous in appearance with some internal vascularity Right ovary Measurements: 2.8 x 1.6 x 3.1 cm. Normal appearance/no adnexal mass. Left ovary  Measurements: 2.1 x 2.0 x 3.0 cm. Normal appearance/no adnexal mass. Pulsed Doppler evaluation of both ovaries demonstrates normal low-resistance arterial and venous waveforms. Other findings Trace free fluid, likely physiologic. IMPRESSION: 1. Slightly heterogeneous appearance of the endometrium without focal abnormality. If bleeding remains unresponsive to hormonal or medical therapy, sonohysterogram should be considered for focal lesion work-up. (Ref: Radiological Reasoning: Algorithmic Workup of Abnormal Vaginal Bleeding with Endovaginal Sonography and Sonohysterography. AJR 2008; 604:V40-98; 191:S68-73) Electronically Signed   By: Obie DredgeWilliam T Derry M.D.   On: 03/21/2017 09:42   [place] Pelvis Complete  Result Date: 03/21/2017 CLINICAL DATA:  Pelvic pain and vaginal bleeding. EXAM: TRANSABDOMINAL AND TRANSVAGINAL ULTRASOUND OF  PELVIS DOPPLER ULTRASOUND OF OVARIES TECHNIQUE: Both transabdominal and transvaginal ultrasound examinations of the pelvis were performed. Transabdominal technique was performed for global imaging of the pelvis including uterus, ovaries, adnexal regions, and pelvic cul-de-sac. It was necessary to proceed with endovaginal exam following the transabdominal exam to visualize the endometrium and ovaries. Color and duplex Doppler ultrasound was utilized to evaluate blood flow to the ovaries. COMPARISON:  CT abdomen pelvis dated April 21, 2016. FINDINGS: Uterus Measurements: 9.5 x 5.1 x 5.4 cm. No fibroids or other mass visualized. Endometrium Thickness: 12 mm. No focal abnormality visualized. Slightly heterogeneous in appearance with some internal vascularity Right ovary Measurements: 2.8 x 1.6 x 3.1 cm. Normal appearance/no adnexal mass. Left ovary Measurements: 2.1 x 2.0 x 3.0 cm. Normal appearance/no adnexal mass. Pulsed Doppler evaluation of both ovaries demonstrates normal low-resistance arterial and venous waveforms. Other findings Trace free fluid, likely physiologic. IMPRESSION: 1. Slightly  heterogeneous appearance of the endometrium without focal abnormality. If bleeding remains unresponsive to hormonal or medical therapy, sonohysterogram should be considered for focal lesion work-up. (Ref: Radiological Reasoning: Algorithmic Workup of Abnormal Vaginal Bleeding with Endovaginal Sonography and Sonohysterography. AJR 2008; 161:W96-04) Electronically Signed   By: Obie Dredge M.D.   On: 03/21/2017 09:42   [place] Art/ven Flow Abd Pelv Doppler  Result Date: 03/21/2017 CLINICAL DATA:  Pelvic pain and vaginal bleeding. EXAM: TRANSABDOMINAL AND TRANSVAGINAL ULTRASOUND OF PELVIS DOPPLER ULTRASOUND OF OVARIES TECHNIQUE: Both transabdominal and transvaginal ultrasound examinations of the pelvis were performed. Transabdominal technique was performed for global imaging of the pelvis including uterus, ovaries, adnexal regions, and pelvic cul-de-sac. It was necessary to proceed with endovaginal exam following the transabdominal exam to visualize the endometrium and ovaries. Color and duplex Doppler ultrasound was utilized to evaluate blood flow to the ovaries. COMPARISON:  CT abdomen pelvis dated April 21, 2016. FINDINGS: Uterus Measurements: 9.5 x 5.1 x 5.4 cm. No fibroids or other mass visualized. Endometrium Thickness: 12 mm. No focal abnormality visualized. Slightly heterogeneous in appearance with some internal vascularity Right ovary Measurements: 2.8 x 1.6 x 3.1 cm. Normal appearance/no adnexal mass. Left ovary Measurements: 2.1 x 2.0 x 3.0 cm. Normal appearance/no adnexal mass. Pulsed Doppler evaluation of both ovaries demonstrates normal low-resistance arterial and venous waveforms. Other findings Trace free fluid, likely physiologic. IMPRESSION: 1. Slightly heterogeneous appearance of the endometrium without focal abnormality. If bleeding remains unresponsive to hormonal or medical therapy, sonohysterogram should be considered for focal lesion work-up. (Ref: Radiological Reasoning: Algorithmic Workup of  Abnormal Vaginal Bleeding with Endovaginal Sonography and Sonohysterography. AJR 2008; 540:J81-19) Electronically Signed   By: Obie Dredge M.D.   On: 03/21/2017 09:42    Procedures Procedures (including critical care time)  Medications Ordered in ED Medications  sodium chloride 0.9 % bolus 1,000 mL (0 mLs Intravenous Stopped 03/21/17 1019)     Initial Impression / Assessment and Plan / ED Course  I have reviewed the triage vital signs and the nursing notes.  Pertinent labs & imaging results that were available during my care of the patient were reviewed by me and considered in my medical decision making (see chart for details).     Donnamarie Trinh Sanjose is a 42 y.o. female here with vaginal bleeding. UCG neg. She is more than 6 months out from D and C and has no fever and I have low suspicion for endometritis. Consider fibroids vs endometrial hyperplasia vs endometrial cancer causing bleeding. Will get labs, transvag US.   10:37 AM UCG neg. Hg 11, baseline.  US showed heterogenous appearance of the endometrium. She has sprintek prescription and I recommend that she takes it as prescribed. She can see GYN for follow up if she has persistent bleeding and at that time, she should consider biopsy to r/o endometrial cancer. Stable for discharge at this time    Final Clinical Impressions(s) / ED Diagnoses   Final diagnoses:  None    ED Discharge Orders    None       Charlynne Pander, MD 03/21/17 1041

## 2017-03-21 NOTE — Discharge Instructions (Signed)
Continue taking sprintek as prescribed by your doctor.   You are likely to have bleeding for another 1-2 weeks.   [facility] if the bleeding doesn't stop in 2 weeks   Return to ER if you have passing out, uncontrolled bleeding, severe pelvic pain, fever

## 2017-03-21 NOTE — ED Notes (Signed)
PT SPEAKS CHINESE. Husband at bedside, speaks very little english.

## 2017-03-21 NOTE — ED Notes (Signed)
Patient in ultrasound.

## 2017-03-24 ENCOUNTER — Other Ambulatory Visit: Payer: Self-pay

## 2017-03-24 ENCOUNTER — Encounter (HOSPITAL_COMMUNITY): Payer: Self-pay

## 2017-03-24 ENCOUNTER — Inpatient Hospital Stay (HOSPITAL_COMMUNITY)
Admission: AD | Admit: 2017-03-24 | Discharge: 2017-03-24 | Disposition: A | Discharge status: Home / Self Care | Payer: Self-pay | Source: Ambulatory Visit | Attending: Obstetrics and Gynecology | Admitting: Obstetrics and Gynecology

## 2017-03-24 DIAGNOSIS — D649 Anemia, unspecified: Secondary | ICD-10-CM | POA: Insufficient documentation

## 2017-03-24 DIAGNOSIS — Z3202 Encounter for pregnancy test, result negative: Secondary | ICD-10-CM | POA: Insufficient documentation

## 2017-03-24 DIAGNOSIS — N939 Abnormal uterine and vaginal bleeding, unspecified: Secondary | ICD-10-CM | POA: Insufficient documentation

## 2017-03-24 HISTORY — DX: Complete or unspecified spontaneous abortion without complication: O03.9

## 2017-03-24 LAB — POCT PREGNANCY, URINE: Preg Test, Ur: NEGATIVE

## 2017-03-24 LAB — GC/CHLAMYDIA PROBE AMP (~~LOC~~) NOT AT ARMC
Chlamydia: NEGATIVE
NEISSERIA GONORRHEA: NEGATIVE

## 2017-03-24 LAB — CBC
HEMATOCRIT: 30.9 % — AB (ref 36.0–46.0)
Hemoglobin: 10.7 g/dL — ABNORMAL LOW (ref 12.0–15.0)
MCH: 30.7 pg (ref 26.0–34.0)
MCHC: 34.6 g/dL (ref 30.0–36.0)
MCV: 88.8 fL (ref 78.0–100.0)
PLATELETS: 159 10*3/uL (ref 150–400)
RBC: 3.48 MIL/uL — ABNORMAL LOW (ref 3.87–5.11)
RDW: 12.2 % (ref 11.5–15.5)
WBC: 5.1 10*3/uL (ref 4.0–10.5)

## 2017-03-24 MED ORDER — MEDROXYPROGESTERONE ACETATE 10 MG PO TABS: 10.0000 mg | ORAL_TABLET | Freq: Every day | ORAL | 0 refills | Status: DC

## 2017-03-24 NOTE — MAU Note (Addendum)
[facility] same, referred to OP clinic. Tried to call for an appt and was told to come here.  States bleeding has increased.  Denies pain.  Has been dizzy

## 2017-03-24 NOTE — MAU Note (Addendum)
Pt had a D&C this September for failed pregnancy. Had a heavy period in November and again now starting in February.  Did start BCP a few days ago, was sent to pharmacy from [facility], but making her nauseous and dizzy

## 2017-03-24 NOTE — Discharge Instructions (Signed)
Get your medicine from the pharmacy and take one pill every day. Expect to bleed again once you finish the medicine. Hopefully, you will start having regular menstrual cycles again. The clinic will call to set up an appointment. You can take an over the counter iron tablet if you want.

## 2017-03-24 NOTE — MAU Provider Note (Signed)
History     CSN: [id number]  Arrival date and time: 03/24/17 0940   First Provider Initiated Contact with Patient 03/24/17 1048      Chief Complaint  Patient presents with  . Vaginal Bleeding   HPI Video interpreter used to communicate with client as she speaks [place]andarin Chinese.  Comes to MAU for increased vaginal bleeding.  Has been bleeding for 4 weeks.  Was seen on March 1 and given a pack of pills but they are making her nauseated and the last one she vomited.  Bleeding has been heavier than a period.  Declines pelvic exam.  Is not having abdominal pain.  Does want to conceive if she can.  OB History    Gravida Para Term Preterm AB Living   3 2 2  0 1 2   SAB TAB Ectopic Multiple Live Births   0 1 0 0 2      Past Medical History:  Diagnosis Date  . Language barrier    Mandarin Congohinese  . Miscarriage   . SAB (spontaneous abortion)     Past Surgical History:  Procedure Laterality Date  . DILATION AND CURETTAGE OF UTERUS N/A 09/10/2016   Procedure: DILATATION AND CURETTAGE;  Surgeon: Huel Coteichardson, Kathy, MD;  [facility];  Service: Gynecology;  Laterality: N/A;    Family History  Problem Relation Age of Onset  . Alcohol abuse Neg Hx   . Arthritis Neg Hx   . Asthma Neg Hx   . Birth defects Neg Hx   . Cancer Neg Hx   . COPD Neg Hx   . Depression Neg Hx   . Diabetes Neg Hx   . Drug abuse Neg Hx   . Early death Neg Hx   . Hearing loss Neg Hx   . Heart disease Neg Hx   . Hyperlipidemia Neg Hx   . Hypertension Neg Hx   . Kidney disease Neg Hx   . Learning disabilities Neg Hx   . Mental illness Neg Hx   . Mental retardation Neg Hx   . Miscarriages / Stillbirths Neg Hx   . Stroke Neg Hx   . Vision loss Neg Hx     Social History   Tobacco Use  . Smoking status: Never Smoker  . Smokeless tobacco: Never Used  Substance Use Topics  . Alcohol use: No  . Drug use: No    Allergies: No Known Allergies  Medications Prior to Admission   Medication Sig Dispense Refill Last Dose  . Norgestimate-Ethinyl Estradiol Triphasic (TRI-SPRINTEC) 0.18/0.215/0.25 MG-35 MCG tablet Take 1 tablet by mouth daily.   03/23/2017 at Unknown time  . sodium chloride (OCEAN) 0.65 % SOLN nasal spray Place 1 spray into both nostrils as needed for congestion.   03/23/2017 at Unknown time  . ibuprofen (ADVIL) 200 MG tablet Take 2 tablets (400 mg total) by mouth every 4 (four) hours as needed. 30 tablet 0     Review of Systems  Constitutional: Negative for fever.  Gastrointestinal: Positive for nausea and vomiting. Negative for abdominal pain, constipation and diarrhea.  Genitourinary: Positive for vaginal bleeding. Negative for dysuria and vaginal discharge.   Physical Exam   Blood pressure (!) 107/57, pulse 69, temperature 98.2 F (36.8 C), temperature source Oral, resp. rate 16, weight 134 lb (60.8 kg), last menstrual period 03/02/2017, SpO2 100 %, unknown if currently breastfeeding.  Physical Exam  Nursing note and vitals reviewed. Constitutional: She is oriented to person, place, and time. She appears  well-developed and well-nourished.  HENT:  Head: Normocephalic.  Eyes: EOM are normal.  Neck: Neck supple.  GI: Soft. There is no tenderness.  Genitourinary:  Genitourinary Comments: Declines pelvic exam  Musculoskeletal: Normal range of motion.  Neurological: She is alert and oriented to person, place, and time.  Skin: Skin is warm and dry.  Psychiatric: She has a normal mood and affect.    MAU Course  Procedures Results for orders placed or performed during the hospital encounter of 03/24/17 (from the past 24 hour(s))  CBC     Status: Abnormal   Collection Time: 03/24/17 11:33 AM  Result Value Ref Range   WBC 5.1 4.0 - 10.5 K/uL   RBC 3.48 (L) 3.87 - 5.11 MIL/uL   Hemoglobin 10.7 (L) 12.0 - 15.0 g/dL   HCT 95.6 (L) 21.3 - 08.6 %   MCV 88.8 78.0 - 100.0 fL   MCH 30.7 26.0 - 34.0 pg   MCHC 34.6 30.0 - 36.0 g/dL   RDW 57.8 46.9 -  62.9 %   Platelets 159 150 - 400 K/uL  Pregnancy, urine POC     Status: None   Collection Time: 03/24/17 11:47 AM  Result Value Ref Range   Preg Test, Ur NEGATIVE NEGATIVE    MDM Since client is not able to take contraceptive pills well without vomiting, will stop the birth control pills and switch to Provera 10 mg PO daily for 10 days.   Message sent to clinic to schedule follow up appointment.  Interpreter used for interview, exam and discharge instructions.  Assessment and Plan  Abnormal vaginal bleeding Very mild anemia - likely due to extended vaginal bleeding.  Plan Provera 10 mg PO daily x 10 days - advised that we expect the bleeding to stop with the pills and then start again once she finishes the medication.  We expect it to be a normal menstrual cycle and not extended bleeding like she is doing now. Advised OTC iron supplement if desired. Clinic to call and schedule an appointment with her for follow up.  Need to check and see if her cycles resume a normal bleeding pattern on their own after the Provera.  Dianne Whelchel L Lakeesha Fontanilla 03/24/2017, 10:57 AM

## 2017-03-24 NOTE — MAU Note (Signed)
Urine sent to lab 

## 2017-03-26 ENCOUNTER — Encounter: Payer: Self-pay | Admitting: General Practice

## 2017-04-23 ENCOUNTER — Encounter: Payer: Self-pay | Admitting: Nurse Practitioner

## 2017-05-01 ENCOUNTER — Encounter: Payer: Self-pay | Admitting: Student

## 2017-05-01 ENCOUNTER — Ambulatory Visit: Payer: BLUE CROSS/BLUE SHIELD | Admitting: Student

## 2017-05-01 VITALS — BP 120/67 | HR 80 | Ht 65.75 in | Wt 138.1 lb

## 2017-05-01 DIAGNOSIS — N939 Abnormal uterine and vaginal bleeding, unspecified: Secondary | ICD-10-CM | POA: Diagnosis not present

## 2017-05-01 DIAGNOSIS — Z8742 Personal history of other diseases of the female genital tract: Secondary | ICD-10-CM | POA: Diagnosis not present

## 2017-05-01 NOTE — Patient Instructions (Signed)

## 2017-05-03 DIAGNOSIS — N939 Abnormal uterine and vaginal bleeding, unspecified: Secondary | ICD-10-CM | POA: Insufficient documentation

## 2017-05-03 NOTE — Progress Notes (Signed)
Subjective:     Patient ID: Sharon Powers, female   DOB: 19-Oct-1975, 42 y.o.   MRN: [id number]  HPI Patient Sharon Powers is a 42 y.o. [phone] Non-pregnant female here for follow-up from MAU visit on 03-24-2017 for treatment for prolonged period of 4 weeks. At that visit she declined a pelvic exam. She was given a 10 day course of Provera and [facility]. She desires to have another child.    Prior to her MAU visit on 3-4, she was seen in the [facility] ED for bleeding and given a pack of pills, which she did not take because they made her vomit.   Patient states that she stopped bleeding on March 21st. She denies abdominal pain, discharge, dysuria, or other ob-gyn complanint.   Based on review of patient's chart, patient appears to be [facility]; however, patient says that she "is here because she wants to be". She does not know if she has had a pap or not.   Interpreter Alfonzo BeersEdith Chu used for entire visit.  Review of Systems  Constitutional: Negative.   HENT: Negative.   Respiratory: Negative.   Cardiovascular: Negative.   Genitourinary: Negative.   Musculoskeletal: Negative.   Neurological: Negative.        Objective:   Physical Exam  Constitutional: She appears well-developed.  HENT:  Head: Normocephalic.  Eyes: Pupils are equal, round, and reactive to light.  Neck: Normal range of motion.  Pulmonary/Chest: Effort normal.  Abdominal: Soft.  Musculoskeletal: Normal range of motion.  Neurological: She is alert.  Skin: Skin is warm.  Psychiatric: She has a normal mood and affect.   Patient declines pelvic exam today.     Assessment:     1. History of abnormal uterine bleeding        Plan:     1. Counseled patient that she can begin trying to conceive anytime now that her bleeding is resolved.  2. She should track her period (timing, duration) 3. Return to MAU if her period returns and she starts feeling SOB, light-headed,  dizziness.  4. She should return in 6 weeks to discuss her period and also have a complete gyn visit plus pap.   5. All questions answered.

## 2017-06-12 ENCOUNTER — Encounter: Payer: Self-pay | Admitting: Student

## 2017-06-12 ENCOUNTER — Ambulatory Visit (INDEPENDENT_AMBULATORY_CARE_PROVIDER_SITE_OTHER): Payer: BLUE CROSS/BLUE SHIELD | Admitting: Student

## 2017-06-12 VITALS — BP 99/46 | HR 68 | Ht 66.0 in | Wt 140.5 lb

## 2017-06-12 DIAGNOSIS — Z1151 Encounter for screening for human papillomavirus (HPV): Secondary | ICD-10-CM

## 2017-06-12 DIAGNOSIS — Z113 Encounter for screening for infections with a predominantly sexual mode of transmission: Secondary | ICD-10-CM | POA: Diagnosis not present

## 2017-06-12 DIAGNOSIS — Z124 Encounter for screening for malignant neoplasm of cervix: Secondary | ICD-10-CM

## 2017-06-12 DIAGNOSIS — N939 Abnormal uterine and vaginal bleeding, unspecified: Secondary | ICD-10-CM

## 2017-06-12 DIAGNOSIS — Z0000 Encounter for general adult medical examination without abnormal findings: Secondary | ICD-10-CM

## 2017-06-12 NOTE — Progress Notes (Addendum)
Patient ID: Sharon Powers, female   DOB: 26-Jun-1975, 42 y.o.   MRN: [id number] History:  [name] is a 42 y.o. [id number] who presents to clinic today for GYN visit with PAP Smear  The following portions of the patient's history were reviewed and updated as appropriate: allergies, current medications, family history, past medical history, social history, past surgical history and problem list.  Review of Systems:  Review of Systems  Constitutional: Negative.   HENT: Negative.   Respiratory: Negative.   Cardiovascular: Negative.   Gastrointestinal: Negative.   Genitourinary: Negative.        Pt. Previously stated a Hx of abnormal uterine bleeding. She states that the irregular bleeding has resolved.  Musculoskeletal: Negative.   Skin: Negative.   Neurological: Negative.   Endo/Heme/Allergies: Negative.   Psychiatric/Behavioral: Negative.       Objective:  Physical Exam BP (!) 99/46   Pulse 68   Ht  (1.676 m)   Wt 140 lb 8 oz (63.7 kg)   LMP 05/15/2017 (Exact Date)   BMI 22.68 kg/m  Physical Exam  Constitutional: She is oriented to person, place, and time. She appears well-nourished.  HENT:  Head: Normocephalic.  Eyes: EOM are normal.  Neck: Normal range of motion.  Cardiovascular: Normal rate, regular rhythm and normal heart sounds.  Pulmonary/Chest: Effort normal and breath sounds normal.  Abdominal: Soft.  Genitourinary: Vagina normal and uterus normal.  Genitourinary Comments: Uterus normal size, shape and conour  Neurological: She is alert and oriented to person, place, and time.  Skin: Skin is warm and dry.  Psychiatric: She has a normal mood and affect.     Labs and Imaging No results found for this or any previous visit (from the past 24 hour(s)).  No results found.   Assessment & Plan:  1. Annual physical exam  - Cytology - PAP  2. Abnormal uterine bleeding See HPI. resovled   Islam, Rushdan M, RN, FNP-DNP Student 06/12/2017 2:56 PM     I confirm that I have verified the information documented in the nurse practitioner students's note and that I have also personally reperformed the physical exam and all medical decision making activities.

## 2017-06-12 NOTE — Progress Notes (Signed)
History:  [name] is a 42 y.o. [id number] who presents to clinic today for pap smear. She is not using contraception; she desires to get pregnant again.   The following portions of the patient's history were reviewed and updated as appropriate: allergies, current medications, family history, past medical history, social history, past surgical history and problem list.  Review of Systems:  Review of Systems  Constitutional: Negative.   HENT: Negative.   Eyes: Negative.   Respiratory: Negative.   Cardiovascular: Negative.   Skin: Negative.   Neurological: Negative.   Psychiatric/Behavioral: Negative.       Objective:  Physical Exam BP (!) 99/46   Pulse 68   Ht  (1.676 m)   Wt 140 lb 8 oz (63.7 kg)   LMP 05/15/2017 (Exact Date)   BMI 22.68 kg/m  Physical Exam BP 126/76 mmHg  Pulse 97  Temp(Src) 98.5 F (36.9 C)  Wt 112 lb (50.803 kg)  LMP 03/02/2014 CONSTITUTIONAL: Well-developed, well-nourished female in no acute distress.  EYES: EOM intact, conjunctivae normal, no scleral icterus HEAD: Normocephalic, atraumatic ENT: External right and left ear normal, oropharynx is clear and moist. CARDIOVASCULAR: Normal heart rate noted, regular rhythm. No cyanosis or edema. 2+ distal pulses.  RESPIRATORY: Clear to auscultation bilaterally. Effort and breath sounds normal, no problems with respiration noted. GASTROINTESTINAL:Soft, normal bowel sounds, no distention noted.  No tenderness, rebound or guarding.  GENITOURINARY: Normal appearing external genitalia; normal appearing vaginal mucosa and cervix.  Normal appearing discharge.  Pap smear obtained.  Normal uterine size, no other palpable masses, no uterine or adnexal tenderness. Breast symmetric in size. No masses, skin changes, nipple drainage or lymphadenopathy noted.  MUSCULOSKELETAL: Normal range of motion. No tenderness. SKIN: Skin is warm and dry. No rash noted. Not diaphoretic. No erythema. No pallor. NEUROLGIC: Alert  and oriented to person, place, and time. Normal reflexes, muscle tone, coordination. No cranial nerve deficit noted. PSYCHIATRIC: Normal mood and affect. Normal behavior. Normal judgment and thought content. HEM/LYMPH/IMMUNOLOGIC: Neck supple, no masses.  Normal thyroid.    Labs and Imaging No results found for this or any previous visit (from the past 24 hour(s)).  No results found.   Assessment & Plan:  1. Annual physical exam Healthy well-woman exam with pap today.  - Cytology - PAP  2. Abnormal uterine bleeding -Patient's bleeding stopped after treatment in April; she had a period on April 25. She will take a pregnancy test if she misses a period and begin prenatal care again if she is pregnant. At this point, she has no concerns about her uterine bleeding.    Marylene Land, CNM 06/12/2017 9:27 PM

## 2017-06-18 LAB — CYTOLOGY - PAP
Chlamydia: NEGATIVE
DIAGNOSIS: NEGATIVE
HPV (WINDOPATH): NOT DETECTED
Neisseria Gonorrhea: NEGATIVE

## 2017-12-30 ENCOUNTER — Ambulatory Visit (INDEPENDENT_AMBULATORY_CARE_PROVIDER_SITE_OTHER): Payer: BLUE CROSS/BLUE SHIELD

## 2017-12-30 DIAGNOSIS — O3680X0 Pregnancy with inconclusive fetal viability, not applicable or unspecified: Secondary | ICD-10-CM | POA: Diagnosis not present

## 2017-12-30 DIAGNOSIS — Z3201 Encounter for pregnancy test, result positive: Secondary | ICD-10-CM

## 2017-12-30 DIAGNOSIS — R112 Nausea with vomiting, unspecified: Secondary | ICD-10-CM

## 2017-12-30 LAB — POCT PREGNANCY, URINE: Preg Test, Ur: POSITIVE — AB

## 2017-12-30 MED ORDER — PROMETHAZINE HCL 25 MG PO TABS: 25.0000 mg | ORAL_TABLET | Freq: Four times a day (QID) | ORAL | 1 refills | Status: DC | PRN

## 2017-12-30 NOTE — Progress Notes (Signed)
Chart reviewed for nurse visit. Agree with plan of care.   Cashlyn Huguley Lauren, DO 12/30/2017 7:16 PM  

## 2017-12-30 NOTE — Progress Notes (Signed)
Pt speaks Mandarin(Chinese)

## 2017-12-30 NOTE — Progress Notes (Signed)
Pt here for UPT-positive, LMP :10/18/17.GA: 1185w3d, EDD: 07/25/18, Reviewed meds and advised to start Prenatal vitamins.Pt states has been nauseated & can't eat.Advsd will send Rx to pharmacy. Pt verbalized understanding.Pt concerned of baby's heart, so reviewed with provider Marcy Sirenatherine Wallace, states ok to schedule US.

## 2018-01-05 ENCOUNTER — Ambulatory Visit (HOSPITAL_COMMUNITY): Payer: BLUE CROSS/BLUE SHIELD

## 2018-01-06 ENCOUNTER — Ambulatory Visit (HOSPITAL_COMMUNITY)
Admission: RE | Admit: 2018-01-06 | Discharge: 2018-01-06 | Disposition: A | Discharge status: Home / Self Care | Payer: BLUE CROSS/BLUE SHIELD | Source: Ambulatory Visit | Attending: Internal Medicine | Admitting: Internal Medicine

## 2018-01-06 ENCOUNTER — Ambulatory Visit (INDEPENDENT_AMBULATORY_CARE_PROVIDER_SITE_OTHER): Payer: BLUE CROSS/BLUE SHIELD | Admitting: General Practice

## 2018-01-06 DIAGNOSIS — Z3A11 11 weeks gestation of pregnancy: Secondary | ICD-10-CM | POA: Insufficient documentation

## 2018-01-06 DIAGNOSIS — Z712 Person consulting for explanation of examination or test findings: Secondary | ICD-10-CM

## 2018-01-06 DIAGNOSIS — O3680X0 Pregnancy with inconclusive fetal viability, not applicable or unspecified: Secondary | ICD-10-CM | POA: Insufficient documentation

## 2018-01-06 NOTE — Progress Notes (Signed)
I reviewed the note and agree with the nursing assessment and plan.   Kale Dols, CNM 04/18/2017 10:32 AM   

## 2018-01-06 NOTE — Progress Notes (Signed)
Patient presents to office today for viability ultrasound results. Reviewed results with Sharon Powers who finds living IUP, patient should begin prenatal care.   Informed patient of results via Education officer, museumMandarin Interpreter, provided pictures, & reviewed dating. [place] recommended [place]. Patient requests appt be made for her due to language barrier. Scheduled appt for 1/2 new OB visit and informed patient. Patient verbalized understanding & asked what she could take for occasional dizziness. Recommended OTC meclizine. Patient verbalized understanding and had no other questions.  Chase Callerarrie H RN BSN 01/06/18

## 2018-01-21 NOTE — L&D Delivery Note (Addendum)
Delivery Note Sharon Powers is a 43 y.o. [phone] at [redacted]w[redacted]d admitted for SOL.  Labor course: Pt admitted at 7/80/-2. Pt quickly progressed to C/C/0. Pt comfortable with epidural and had no urge to push so labored down. At 0310 pt began to push. ROM: 10h 29m with clear fluid  At 0423 a viable boy was delivered via spontaneous vaginal delivery (Presentation: vertex; LOA).  Infant placed directly on mom's abdomen for bonding/skin-to-skin. Delayed cord clamping x 46min, then cord clamped x 2, and cut by SNM. APGAR: 9,9; weight: pending at time of note. 40 units of pitocin diluted in 1000cc LR was infused rapidly IV per protocol. The placenta separated spontaneously and delivered via CCT and maternal pushing effort.  It was inspected and appears to be intact with a 3 VC.  Placenta/Cord with the following complications: none. Cord pH: n/a  Intrapartum complications:  None Anesthesia:  epidural Episiotomy: none Lacerations:  2nd degree Suture Repair: 2.0 vicryl Est. Blood Loss (mL): 90 Sponge and instrument count were correct x2.  Mom to postpartum.  Baby to Couplet care / Skin to Skin. Placenta to L&D. Plans to bottle feed Contraception: POPs Circ: yes, outpatient  Renee Harder, SNM 07/07/2018 4:54 AM   The above was performed under my direct supervision and guidance.

## 2018-01-22 ENCOUNTER — Encounter: Payer: Self-pay | Admitting: Obstetrics & Gynecology

## 2018-01-22 ENCOUNTER — Ambulatory Visit (INDEPENDENT_AMBULATORY_CARE_PROVIDER_SITE_OTHER): Payer: Medicaid Other | Admitting: Obstetrics & Gynecology

## 2018-01-22 DIAGNOSIS — Z3480 Encounter for supervision of other normal pregnancy, unspecified trimester: Secondary | ICD-10-CM

## 2018-01-22 DIAGNOSIS — Z3482 Encounter for supervision of other normal pregnancy, second trimester: Secondary | ICD-10-CM

## 2018-01-22 DIAGNOSIS — Z3201 Encounter for pregnancy test, result positive: Secondary | ICD-10-CM

## 2018-01-22 DIAGNOSIS — Z23 Encounter for immunization: Secondary | ICD-10-CM | POA: Diagnosis not present

## 2018-01-22 DIAGNOSIS — O09522 Supervision of elderly multigravida, second trimester: Secondary | ICD-10-CM

## 2018-01-22 DIAGNOSIS — O09529 Supervision of elderly multigravida, unspecified trimester: Secondary | ICD-10-CM | POA: Insufficient documentation

## 2018-01-22 MED ORDER — PRENATAL MULTIVITAMIN CH: 1.0000 | ORAL_TABLET | Freq: Every day | ORAL | 3 refills | Status: AC

## 2018-01-22 NOTE — Progress Notes (Signed)
Patient plans to bottle feed.

## 2018-01-22 NOTE — Progress Notes (Signed)
  Subjective:    Sharon Powers is being seen today for her first obstetrical visit.  This is a planned pregnancy. She is at [redacted]w[redacted]d gestation. Her obstetrical history is significant for advanced maternal age. Relationship with FOB: spouse, living together. Patient does not intend to breast feed. Pregnancy history fully reviewed.  Patient reports no complaints.  Review of Systems:   Review of Systems  Objective:     BP (!) 106/50   Pulse 88   Ht 5\' 5"  (1.651 m)   Wt 141 lb (64 kg)   LMP 10/18/2017   BMI 23.46 kg/m  Physical Exam  Exam    Assessment:    Pregnancy: [id number] Patient Active Problem List   Diagnosis Date Noted  . Supervision of other normal pregnancy, antepartum 01/22/2018  . AMA (advanced maternal age) multigravida 35+ 01/22/2018  . Abnormal uterine bleeding 05/03/2017  . Normochromic normocytic anemia 04/22/2016  . Thrombocytopenia (HCC) 04/22/2016  . Pyelonephritis 04/21/2016       Plan:     Initial labs drawn. Prenatal vitamins. Problem list reviewed and updated. NIPS drawn today Role of ultrasound in pregnancy discussed; fetal survey: requested. Flu vaccine today Follow up in 4 weeks.   Allie Bossier 01/22/2018

## 2018-01-24 LAB — OBSTETRIC PANEL, INCLUDING HIV
Antibody Screen: NEGATIVE
Basophils Absolute: 0 10*3/uL (ref 0.0–0.2)
Basos: 0 %
EOS (ABSOLUTE): 0 10*3/uL (ref 0.0–0.4)
Eos: 1 %
HEP B S AG: NEGATIVE
HIV Screen 4th Generation wRfx: NONREACTIVE
Hematocrit: 34.1 % (ref 34.0–46.6)
Hemoglobin: 11.5 g/dL (ref 11.1–15.9)
IMMATURE GRANS (ABS): 0 10*3/uL (ref 0.0–0.1)
Immature Granulocytes: 0 %
Lymphocytes Absolute: 1.5 10*3/uL (ref 0.7–3.1)
Lymphs: 18 %
MCH: 30.2 pg (ref 26.6–33.0)
MCHC: 33.7 g/dL (ref 31.5–35.7)
MCV: 90 fL (ref 79–97)
Monocytes Absolute: 0.6 10*3/uL (ref 0.1–0.9)
Monocytes: 7 %
Neutrophils Absolute: 6.3 10*3/uL (ref 1.4–7.0)
Neutrophils: 74 %
Platelets: 236 10*3/uL (ref 150–450)
RBC: 3.81 x10E6/uL (ref 3.77–5.28)
RDW: 14.3 % (ref 12.3–15.4)
RPR Ser Ql: NONREACTIVE
Rh Factor: POSITIVE
Rubella Antibodies, IGG: 2.76 index (ref >0.99)
WBC: 8.4 10*3/uL (ref 3.4–10.8)

## 2018-02-16 ENCOUNTER — Encounter (HOSPITAL_COMMUNITY): Payer: Self-pay

## 2018-02-23 ENCOUNTER — Ambulatory Visit (HOSPITAL_COMMUNITY)
Admission: RE | Admit: 2018-02-23 | Discharge: 2018-02-23 | Disposition: A | Discharge status: Home / Self Care | Payer: Medicaid Other | Source: Ambulatory Visit | Attending: Obstetrics & Gynecology | Admitting: Obstetrics & Gynecology

## 2018-02-23 ENCOUNTER — Other Ambulatory Visit (HOSPITAL_COMMUNITY): Payer: Self-pay | Admitting: *Deleted

## 2018-02-23 ENCOUNTER — Encounter (HOSPITAL_COMMUNITY): Payer: Self-pay

## 2018-02-23 ENCOUNTER — Ambulatory Visit (HOSPITAL_COMMUNITY): Payer: Self-pay | Admitting: Obstetrics & Gynecology

## 2018-02-23 DIAGNOSIS — Z3480 Encounter for supervision of other normal pregnancy, unspecified trimester: Secondary | ICD-10-CM | POA: Insufficient documentation

## 2018-02-23 DIAGNOSIS — O09522 Supervision of elderly multigravida, second trimester: Secondary | ICD-10-CM

## 2018-02-23 DIAGNOSIS — Z363 Encounter for antenatal screening for malformations: Secondary | ICD-10-CM | POA: Diagnosis not present

## 2018-02-23 DIAGNOSIS — Z362 Encounter for other antenatal screening follow-up: Secondary | ICD-10-CM

## 2018-02-23 DIAGNOSIS — Z3A18 18 weeks gestation of pregnancy: Secondary | ICD-10-CM | POA: Diagnosis not present

## 2018-02-23 NOTE — ED Notes (Signed)
Patient and interpreter in session with Genetic Counselor, Lauren taylor.

## 2018-02-24 ENCOUNTER — Encounter: Payer: Self-pay | Admitting: Advanced Practice Midwife

## 2018-02-24 ENCOUNTER — Ambulatory Visit (INDEPENDENT_AMBULATORY_CARE_PROVIDER_SITE_OTHER): Payer: Medicaid Other | Admitting: Advanced Practice Midwife

## 2018-02-24 VITALS — BP 116/66 | HR 105 | Wt 155.1 lb

## 2018-02-24 DIAGNOSIS — Z3482 Encounter for supervision of other normal pregnancy, second trimester: Secondary | ICD-10-CM

## 2018-02-24 DIAGNOSIS — N949 Unspecified condition associated with female genital organs and menstrual cycle: Secondary | ICD-10-CM

## 2018-02-24 DIAGNOSIS — Z3480 Encounter for supervision of other normal pregnancy, unspecified trimester: Secondary | ICD-10-CM

## 2018-02-24 NOTE — Progress Notes (Signed)
   PRENATAL VISIT NOTE  Subjective:  Sharon Powers is a 43 y.o. [phone] at [redacted]w[redacted]d being seen today for ongoing prenatal care.  She is currently monitored for the following issues for this low-risk pregnancy and has Pyelonephritis; Normochromic normocytic anemia; Thrombocytopenia (HCC); Abnormal uterine bleeding; Supervision of other normal pregnancy, antepartum; and AMA (advanced maternal age) multigravida 35+ on their problem list.  Patient reports sharp pains intermittently in round ligaments bilaterally.  Contractions: Not present. Vag. Bleeding: None.   . Denies leaking of fluid.   The following portions of the patient's history were reviewed and updated as appropriate: allergies, current medications, past family history, past medical history, past social history, past surgical history and problem list. Problem list updated.  Objective:   Vitals:   02/24/18 0823  BP: 116/66  Pulse: (!) 105  Weight: 70.4 kg    Fetal Status: Fetal Heart Rate (bpm): 160 Fundal Height: 18 cm       General:  Alert, oriented and cooperative. Patient is in no acute distress.  Skin: Skin is warm and dry. No rash noted.   Cardiovascular: Normal heart rate noted  Respiratory: Normal respiratory effort, no problems with respiration noted  Abdomen: Soft, gravid, appropriate for gestational age.  Pain/Pressure: Present     Pelvic: Cervical exam deferred        Extremities: Normal range of motion.  Edema: None  Mental Status: Normal mood and affect. Normal behavior. Normal judgment and thought content.   Assessment and Plan:  Pregnancy: [id number] at [redacted]w[redacted]d  In person Chinese/Madarin  interpretor present  1. Supervision of other normal pregnancy, antepartum Reviewed round ligament pain.  Does not describe menstrual like pain, but rather pain c/w RLP.  Discussed maternity support belt and that this will likely resolve over time.  - AFP, Serum, Open Spina Bifida - Culture, OB Urine  Preterm labor symptoms and  general obstetric precautions including but not limited to vaginal bleeding, contractions, leaking of fluid and fetal movement were reviewed in detail with the patient. Please refer to After Visit Summary for other counseling recommendations.  No follow-ups on file.  Future Appointments  Date Time Provider Department Center  03/24/2018 10:00 AM [facility] [place] 3 [facility]-M[facility]-US  03/25/2018  1:30 PM Willodean Rosenthal, MD [facility] None    Wynelle Bourgeois, CNM

## 2018-02-24 NOTE — Progress Notes (Signed)
Language resources interpreter Hsiao-Wei

## 2018-02-24 NOTE — Patient Instructions (Signed)

## 2018-02-25 LAB — HEMOGLOBINOPATHY EVALUATION
HGB A2 QUANT: 2.4 % (ref 1.8–3.2)
Hgb A: 97.6 % (ref 96.4–98.8)
Hgb C: 0 %
Hgb F Quant: 0 % (ref 0.0–2.0)
Hgb S Quant: 0 %
Hgb Variant: 0 %

## 2018-02-26 LAB — AFP, SERUM, OPEN SPINA BIFIDA
AFP MoM: 1.17
AFP Value: 49.5 ng/mL
Gest. Age on Collection Date: 18 weeks
Maternal Age At EDD: 42.5 yr
OSBR Risk 1 IN: 7137
Test Results:: NEGATIVE
Weight: 155 [lb_av]

## 2018-02-26 LAB — CULTURE, OB URINE

## 2018-02-26 LAB — URINE CULTURE, OB REFLEX: Organism ID, Bacteria: NO GROWTH

## 2018-02-27 ENCOUNTER — Other Ambulatory Visit (HOSPITAL_COMMUNITY): Payer: Self-pay | Admitting: Obstetrics & Gynecology

## 2018-03-02 ENCOUNTER — Other Ambulatory Visit (HOSPITAL_COMMUNITY): Payer: Self-pay | Admitting: Obstetrics & Gynecology

## 2018-03-24 ENCOUNTER — Ambulatory Visit (HOSPITAL_COMMUNITY): Payer: Medicaid Other | Admitting: *Deleted

## 2018-03-24 ENCOUNTER — Other Ambulatory Visit (HOSPITAL_COMMUNITY): Payer: Self-pay | Admitting: *Deleted

## 2018-03-24 ENCOUNTER — Ambulatory Visit (HOSPITAL_COMMUNITY)
Admission: RE | Admit: 2018-03-24 | Discharge: 2018-03-24 | Disposition: A | Discharge status: Home / Self Care | Payer: Medicaid Other | Source: Ambulatory Visit | Attending: Obstetrics and Gynecology | Admitting: Obstetrics and Gynecology

## 2018-03-24 ENCOUNTER — Other Ambulatory Visit (HOSPITAL_COMMUNITY): Payer: Self-pay | Admitting: Obstetrics and Gynecology

## 2018-03-24 ENCOUNTER — Encounter (HOSPITAL_COMMUNITY): Payer: Self-pay

## 2018-03-24 VITALS — BP 111/64 | HR 98 | Wt 161.6 lb

## 2018-03-24 DIAGNOSIS — Z3686 Encounter for antenatal screening for cervical length: Secondary | ICD-10-CM

## 2018-03-24 DIAGNOSIS — O09522 Supervision of elderly multigravida, second trimester: Secondary | ICD-10-CM | POA: Insufficient documentation

## 2018-03-24 DIAGNOSIS — Z362 Encounter for other antenatal screening follow-up: Secondary | ICD-10-CM | POA: Insufficient documentation

## 2018-03-24 DIAGNOSIS — Z3A22 22 weeks gestation of pregnancy: Secondary | ICD-10-CM

## 2018-03-24 DIAGNOSIS — O09529 Supervision of elderly multigravida, unspecified trimester: Secondary | ICD-10-CM

## 2018-03-24 NOTE — Progress Notes (Unsigned)
.  mfm

## 2018-03-24 NOTE — Progress Notes (Signed)
Mandrin interpreter [phone] utilized during visit.

## 2018-03-25 ENCOUNTER — Ambulatory Visit (INDEPENDENT_AMBULATORY_CARE_PROVIDER_SITE_OTHER): Payer: Medicaid Other | Admitting: Obstetrics & Gynecology

## 2018-03-25 VITALS — BP 110/57 | HR 93 | Wt 160.1 lb

## 2018-03-25 DIAGNOSIS — O09529 Supervision of elderly multigravida, unspecified trimester: Secondary | ICD-10-CM

## 2018-03-25 DIAGNOSIS — Z789 Other specified health status: Secondary | ICD-10-CM

## 2018-03-25 DIAGNOSIS — Z3480 Encounter for supervision of other normal pregnancy, unspecified trimester: Secondary | ICD-10-CM

## 2018-03-25 DIAGNOSIS — O09522 Supervision of elderly multigravida, second trimester: Secondary | ICD-10-CM

## 2018-03-25 DIAGNOSIS — O26872 Cervical shortening, second trimester: Secondary | ICD-10-CM

## 2018-03-25 DIAGNOSIS — R399 Unspecified symptoms and signs involving the genitourinary system: Secondary | ICD-10-CM

## 2018-03-25 DIAGNOSIS — O26879 Cervical shortening, unspecified trimester: Secondary | ICD-10-CM

## 2018-03-25 DIAGNOSIS — Z3A22 22 weeks gestation of pregnancy: Secondary | ICD-10-CM

## 2018-03-25 DIAGNOSIS — R350 Frequency of micturition: Secondary | ICD-10-CM

## 2018-03-25 LAB — POCT URINALYSIS DIPSTICK OB
Bilirubin, UA: NEGATIVE
Glucose, UA: NEGATIVE
Ketones, UA: NEGATIVE
Nitrite, UA: NEGATIVE
PROTEIN: NEGATIVE
SPEC GRAV UA: 1.015 (ref 1.010–1.025)
pH, UA: 7 (ref 5.0–8.0)

## 2018-03-25 MED ORDER — CEPHALEXIN 500 MG PO CAPS: 500.0000 mg | ORAL_CAPSULE | Freq: Three times a day (TID) | ORAL | 0 refills | Status: DC

## 2018-03-25 NOTE — Progress Notes (Signed)
   PRENATAL VISIT NOTE  Subjective:  Sharon Powers is a 43 y.o. [phone] at [redacted]w[redacted]d being seen today for ongoing prenatal care.  She is currently monitored for the following issues for this low-risk pregnancy and has Pyelonephritis; Normochromic normocytic anemia; Thrombocytopenia (HCC); Abnormal uterine bleeding; Supervision of other normal pregnancy, antepartum; and AMA (advanced maternal age) multigravida 35+ on their problem list.  Patient reports lower abd cramping occ.   Contractions: Not present. Vag. Bleeding: None.  Movement: Present. Denies leaking of fluid.   The following portions of the patient's history were reviewed and updated as appropriate: allergies, current medications, past family history, past medical history, past social history, past surgical history and problem list. Problem list updated.  Objective:   Vitals:   03/25/18 1320  BP: (!) 110/57  Pulse: 93  Weight: 160 lb 1.3 oz (72.6 kg)    Fetal Status: Fetal Heart Rate (bpm): 157   Movement: Present     General:  Alert, oriented and cooperative. Patient is in no acute distress.  Skin: Skin is warm and dry. No rash noted.   Cardiovascular: Normal heart rate noted  Respiratory: Normal respiratory effort, no problems with respiration noted  Abdomen: Soft, gravid, appropriate for gestational age.  Pain/Pressure: Present     Pelvic: Cervical exam deferred        Extremities: Normal range of motion.  Edema: Trace  Mental Status: Normal mood and affect. Normal behavior. Normal judgment and thought content.   Assessment and Plan:  Pregnancy: [id number] at [redacted]w[redacted]d  1. Supervision of other normal pregnancy, antepartum   2. Antepartum multigravida of advanced maternal age Genetic testing  WNL  3. Shortened cervix 03/24/2018 Impression  Advanced maternal age 20  Cervix 2.2 cm no funneling  normal interval growth  Anatomy complete. ---------------------------------------------------------------------- Recommendations  Follow up CL in 1 weeks  Follow up growth in 4 week.  4. Language barrier affecting pregnancy In person interpreter used for entire visit.   Preterm labor symptoms and general obstetric precautions including but not limited to vaginal bleeding, contractions, leaking of fluid and fetal movement were reviewed in detail with the patient. Please refer to After Visit Summary for other counseling recommendations.  Return in about 4 weeks (around 04/22/2018).  Future Appointments  Date Time Provider Department Center  04/01/2018  8:40 AM [facility] NURSE [facility]  04/01/2018  8:50 AM [facility] NURSE [facility]US  04/01/2018  9:00 AM [facility] [place] 3 [facility]-US  04/22/2018  9:00 AM [facility] [place] 3 [facility]-M[facility]-US    Willodean Rosenthal, MD

## 2018-03-25 NOTE — Progress Notes (Signed)
Language resources interpreter Hsiao-Wei

## 2018-03-26 DIAGNOSIS — Z789 Other specified health status: Secondary | ICD-10-CM | POA: Insufficient documentation

## 2018-03-27 LAB — URINE CULTURE, OB REFLEX: Organism ID, Bacteria: NO GROWTH

## 2018-03-27 LAB — CULTURE, OB URINE

## 2018-04-01 ENCOUNTER — Ambulatory Visit (HOSPITAL_COMMUNITY): Payer: Medicaid Other | Admitting: *Deleted

## 2018-04-01 ENCOUNTER — Encounter (HOSPITAL_COMMUNITY): Payer: Self-pay

## 2018-04-01 ENCOUNTER — Other Ambulatory Visit: Payer: Self-pay

## 2018-04-01 ENCOUNTER — Other Ambulatory Visit (HOSPITAL_COMMUNITY): Payer: Self-pay | Admitting: *Deleted

## 2018-04-01 ENCOUNTER — Ambulatory Visit (HOSPITAL_COMMUNITY)
Admission: RE | Admit: 2018-04-01 | Discharge: 2018-04-01 | Disposition: A | Discharge status: Home / Self Care | Payer: Medicaid Other | Source: Ambulatory Visit | Attending: Obstetrics and Gynecology | Admitting: Obstetrics and Gynecology

## 2018-04-01 ENCOUNTER — Other Ambulatory Visit: Payer: Self-pay | Admitting: Obstetrics and Gynecology

## 2018-04-01 ENCOUNTER — Ambulatory Visit (HOSPITAL_COMMUNITY): Payer: Medicaid Other

## 2018-04-01 VITALS — BP 107/58 | HR 96 | Wt 162.4 lb

## 2018-04-01 DIAGNOSIS — O09523 Supervision of elderly multigravida, third trimester: Secondary | ICD-10-CM | POA: Diagnosis not present

## 2018-04-01 DIAGNOSIS — O26872 Cervical shortening, second trimester: Secondary | ICD-10-CM

## 2018-04-01 DIAGNOSIS — Z3686 Encounter for antenatal screening for cervical length: Secondary | ICD-10-CM | POA: Diagnosis not present

## 2018-04-01 DIAGNOSIS — Z3A23 23 weeks gestation of pregnancy: Secondary | ICD-10-CM

## 2018-04-01 DIAGNOSIS — O09529 Supervision of elderly multigravida, unspecified trimester: Secondary | ICD-10-CM | POA: Insufficient documentation

## 2018-04-01 DIAGNOSIS — O09522 Supervision of elderly multigravida, second trimester: Secondary | ICD-10-CM | POA: Diagnosis not present

## 2018-04-01 MED ORDER — PROGESTERONE MICRONIZED 200 MG PO CAPS: ORAL_CAPSULE | ORAL | 3 refills | Status: DC

## 2018-04-01 NOTE — Progress Notes (Signed)
Interpreter [phone] utilized during visit.

## 2018-04-09 ENCOUNTER — Ambulatory Visit (INDEPENDENT_AMBULATORY_CARE_PROVIDER_SITE_OTHER): Payer: Medicaid Other | Admitting: Family Medicine

## 2018-04-09 ENCOUNTER — Other Ambulatory Visit: Payer: Self-pay

## 2018-04-09 VITALS — BP 117/62 | HR 104 | Wt 163.0 lb

## 2018-04-09 DIAGNOSIS — O09529 Supervision of elderly multigravida, unspecified trimester: Secondary | ICD-10-CM

## 2018-04-09 DIAGNOSIS — Z3480 Encounter for supervision of other normal pregnancy, unspecified trimester: Secondary | ICD-10-CM

## 2018-04-09 DIAGNOSIS — O09522 Supervision of elderly multigravida, second trimester: Secondary | ICD-10-CM

## 2018-04-09 DIAGNOSIS — O26872 Cervical shortening, second trimester: Secondary | ICD-10-CM

## 2018-04-09 DIAGNOSIS — O26879 Cervical shortening, unspecified trimester: Secondary | ICD-10-CM

## 2018-04-09 DIAGNOSIS — Z789 Other specified health status: Secondary | ICD-10-CM

## 2018-04-09 DIAGNOSIS — Z3A24 24 weeks gestation of pregnancy: Secondary | ICD-10-CM

## 2018-04-09 NOTE — Progress Notes (Signed)
   PRENATAL VISIT NOTE  Subjective:  Sharon Powers is a 43 y.o. [phone] at [redacted]w[redacted]d being seen today for ongoing prenatal care.  She is currently monitored for the following issues for this low-risk pregnancy and has Pyelonephritis; Normochromic normocytic anemia; Thrombocytopenia (HCC); Abnormal uterine bleeding; Supervision of other normal pregnancy, antepartum; AMA (advanced maternal age) multigravida 35+; Short cervix, antepartum; and Language barrier affecting health care on their problem list.  Patient reports no complaints.  Contractions: Not present. Vag. Bleeding: None.  Movement: Present. Denies leaking of fluid.   The following portions of the patient's history were reviewed and updated as appropriate: allergies, current medications, past family history, past medical history, past social history, past surgical history and problem list.   Objective:   Vitals:   04/09/18 0915  BP: 117/62  Pulse: (!) 104  Weight: 163 lb (73.9 kg)    Fetal Status: Fetal Heart Rate (bpm): 150 Fundal Height: 25 cm Movement: Present     General:  Alert, oriented and cooperative. Patient is in no acute distress.  Skin: Skin is warm and dry. No rash noted.   Cardiovascular: Normal heart rate noted  Respiratory: Normal respiratory effort, no problems with respiration noted  Abdomen: Soft, gravid, appropriate for gestational age.  Pain/Pressure: Absent     Pelvic: Cervical exam deferred        Extremities: Normal range of motion.  Edema: Trace  Mental Status: Normal mood and affect. Normal behavior. Normal judgment and thought content.   Assessment and Plan:  Pregnancy: [id number] at [redacted]w[redacted]d 1. Supervision of other normal pregnancy, antepartum FHT and FH normal  2. Antepartum multigravida of advanced maternal age Genetic screening low risk  3. Short cervix, antepartum Has rpt [place] scheduled  4. Language barrier affecting health care Interpreter used  Preterm labor symptoms and general obstetric  precautions including but not limited to vaginal bleeding, contractions, leaking of fluid and fetal movement were reviewed in detail with the patient. Please refer to After Visit Summary for other counseling recommendations.   Return in about 4 weeks (around 05/07/2018) for OB f/u, 2 hr GTT.  Future Appointments  Date Time Provider Department Center  04/22/2018  8:50 AM [facility]-M[facility] NURSE WH-MFC MFC-US  04/22/2018  9:00 AM [facility]-MFC [place] 3 WH-MFCUS MFC-US    Levie Heritage, DO

## 2018-04-22 ENCOUNTER — Ambulatory Visit (HOSPITAL_COMMUNITY): Payer: Medicaid Other

## 2018-05-07 ENCOUNTER — Encounter: Payer: Medicaid Other | Admitting: Family Medicine

## 2018-05-14 ENCOUNTER — Other Ambulatory Visit: Payer: Self-pay

## 2018-05-14 ENCOUNTER — Ambulatory Visit (INDEPENDENT_AMBULATORY_CARE_PROVIDER_SITE_OTHER): Payer: Medicaid Other | Admitting: Family Medicine

## 2018-05-14 VITALS — BP 106/45 | HR 101 | Temp 98.0°F | Wt 166.0 lb

## 2018-05-14 DIAGNOSIS — Z23 Encounter for immunization: Secondary | ICD-10-CM | POA: Diagnosis not present

## 2018-05-14 DIAGNOSIS — Z3A29 29 weeks gestation of pregnancy: Secondary | ICD-10-CM

## 2018-05-14 DIAGNOSIS — O26873 Cervical shortening, third trimester: Secondary | ICD-10-CM

## 2018-05-14 DIAGNOSIS — O09523 Supervision of elderly multigravida, third trimester: Secondary | ICD-10-CM

## 2018-05-14 DIAGNOSIS — Z789 Other specified health status: Secondary | ICD-10-CM

## 2018-05-14 DIAGNOSIS — Z3480 Encounter for supervision of other normal pregnancy, unspecified trimester: Secondary | ICD-10-CM

## 2018-05-14 DIAGNOSIS — O26879 Cervical shortening, unspecified trimester: Secondary | ICD-10-CM

## 2018-05-14 DIAGNOSIS — O09522 Supervision of elderly multigravida, second trimester: Secondary | ICD-10-CM

## 2018-05-14 NOTE — Patient Instructions (Signed)
[facility] [address]tr[address] [place]

## 2018-05-14 NOTE — Progress Notes (Addendum)
   PRENATAL VISIT NOTE  Subjective:  Sharon Powers is a 43 y.o. [phone] at [redacted]w[redacted]d being seen today for ongoing prenatal care.  She is currently monitored for the following issues for this high-risk pregnancy and has Pyelonephritis; Normochromic normocytic anemia; Thrombocytopenia (HCC); Abnormal uterine bleeding; Supervision of other normal pregnancy, antepartum; AMA (advanced maternal age) multigravida 35+; Short cervix, antepartum; and Language barrier affecting health care on their problem list.  Patient reports no complaints.  Contractions: Not present. Vag. Bleeding: None.  Movement: Present. Denies leaking of fluid.   The following portions of the patient's history were reviewed and updated as appropriate: allergies, current medications, past family history, past medical history, past social history, past surgical history and problem list.   Objective:   Vitals:   05/14/18 0824  BP: (!) 106/45  Pulse: (!) 101  Temp: 98 F (36.7 C)  Weight: 166 lb (75.3 kg)    Fetal Status: Fetal Heart Rate (bpm): 155   Movement: Present     General:  Alert, oriented and cooperative. Patient is in no acute distress.  Skin: Skin is warm and dry. No rash noted.   Cardiovascular: Normal heart rate noted  Respiratory: Normal respiratory effort, no problems with respiration noted  Abdomen: Soft, gravid, appropriate for gestational age.  Pain/Pressure: Absent     Pelvic: Cervical exam deferred        Extremities: Normal range of motion.  Edema: Trace  Mental Status: Normal mood and affect. Normal behavior. Normal judgment and thought content.   Assessment and Plan:  Pregnancy: [id number] at [redacted]w[redacted]d  1. Supervision of other normal pregnancy, antepartum FHT and FH normal - Glucose Tolerance, 2 Hours w/1 Hour - CBC - RPR - HIV antibody (with reflex)  2. Multigravida of advanced maternal age in second trimester [place] in May - Glucose Tolerance, 2 Hours w/1 Hour - CBC - RPR - HIV antibody (with reflex)   3. Short cervix, antepartum Not on prometrium. Patient is afraid of taking prometrium because she is afraid that it will affect the baby. I discussed with her that it was safe to use. Patient still reluctant to take it. Discussed risk of preterm labor and birth is increased if she is not on it.  4. Language barrier affecting health care Interpreter used   Preterm labor symptoms and general obstetric precautions including but not limited to vaginal bleeding, contractions, leaking of fluid and fetal movement were reviewed in detail with the patient. Please refer to After Visit Summary for other counseling recommendations.   Return in about 4 weeks (around 06/11/2018) for OB f/u.  Future Appointments  Date Time Provider Department Center  05/27/2018 11:10 AM [facility]-M[facility] NURSE WH-MFC MFC-US  05/27/2018 11:15 AM WH-MFC [place] 4 WH-MFCUS MFC-US    Levie Heritage, DO

## 2018-05-14 NOTE — Progress Notes (Signed)
Interpreter [phone] Patient is doing 28 week labwork today. Armandina Stammer RN

## 2018-05-15 ENCOUNTER — Telehealth: Payer: Self-pay

## 2018-05-15 ENCOUNTER — Other Ambulatory Visit: Payer: Self-pay | Admitting: Family Medicine

## 2018-05-15 LAB — CBC
Hematocrit: 30.6 % — ABNORMAL LOW (ref 34.0–46.6)
Hemoglobin: 10.4 g/dL — ABNORMAL LOW (ref 11.1–15.9)
MCH: 31.1 pg (ref 26.6–33.0)
MCHC: 34 g/dL (ref 31.5–35.7)
MCV: 92 fL (ref 79–97)
Platelets: 206 10*3/uL (ref 150–450)
RBC: 3.34 x10E6/uL — ABNORMAL LOW (ref 3.77–5.28)
RDW: 11.7 % (ref 11.7–15.4)
WBC: 10.1 10*3/uL (ref 3.4–10.8)

## 2018-05-15 LAB — GLUCOSE TOLERANCE, 2 HOURS W/ 1HR
Glucose, 1 hour: 118 mg/dL (ref 65–179)
Glucose, 2 hour: 112 mg/dL (ref 65–152)
Glucose, Fasting: 88 mg/dL (ref 65–91)

## 2018-05-15 LAB — HIV ANTIBODY (ROUTINE TESTING W REFLEX): HIV Screen 4th Generation wRfx: NONREACTIVE

## 2018-05-15 LAB — RPR: RPR Ser Ql: NONREACTIVE

## 2018-05-15 MED ORDER — FERROUS SULFATE 325 (65 FE) MG PO TABS: 325.0000 mg | ORAL_TABLET | Freq: Two times a day (BID) | ORAL | 1 refills | Status: DC

## 2018-05-15 NOTE — Telephone Encounter (Signed)
-----   Message from Levie Heritage, DO sent at 05/15/2018  8:15 AM EDT ----- Patient is anemic. Needs to start Iron BID.

## 2018-05-15 NOTE — Telephone Encounter (Signed)
Used mandrin interpreter # [phone]  Made patient aware she can buy ferrous sulfate over the coutner and take it twice a day. Also informed of iron rich food she can consume to help with increasing hgb. Patient states understanding. Armandina Stammer RN

## 2018-05-25 ENCOUNTER — Other Ambulatory Visit: Payer: Self-pay

## 2018-05-25 DIAGNOSIS — Z3480 Encounter for supervision of other normal pregnancy, unspecified trimester: Secondary | ICD-10-CM

## 2018-05-27 ENCOUNTER — Ambulatory Visit (HOSPITAL_COMMUNITY): Payer: Medicaid Other | Admitting: *Deleted

## 2018-05-27 ENCOUNTER — Ambulatory Visit (HOSPITAL_COMMUNITY)
Admission: RE | Admit: 2018-05-27 | Discharge: 2018-05-27 | Disposition: A | Discharge status: Home / Self Care | Payer: Medicaid Other | Source: Ambulatory Visit | Attending: Maternal & Fetal Medicine | Admitting: Maternal & Fetal Medicine

## 2018-05-27 ENCOUNTER — Other Ambulatory Visit (HOSPITAL_COMMUNITY): Payer: Self-pay | Admitting: *Deleted

## 2018-05-27 ENCOUNTER — Encounter (HOSPITAL_COMMUNITY): Payer: Self-pay

## 2018-05-27 ENCOUNTER — Other Ambulatory Visit: Payer: Self-pay

## 2018-05-27 DIAGNOSIS — Z362 Encounter for other antenatal screening follow-up: Secondary | ICD-10-CM

## 2018-05-27 DIAGNOSIS — O09529 Supervision of elderly multigravida, unspecified trimester: Secondary | ICD-10-CM | POA: Insufficient documentation

## 2018-05-27 DIAGNOSIS — Z3A31 31 weeks gestation of pregnancy: Secondary | ICD-10-CM | POA: Diagnosis not present

## 2018-05-27 DIAGNOSIS — O26873 Cervical shortening, third trimester: Secondary | ICD-10-CM

## 2018-05-27 DIAGNOSIS — O26872 Cervical shortening, second trimester: Secondary | ICD-10-CM | POA: Diagnosis not present

## 2018-05-27 DIAGNOSIS — Z789 Other specified health status: Secondary | ICD-10-CM

## 2018-05-27 DIAGNOSIS — O26879 Cervical shortening, unspecified trimester: Secondary | ICD-10-CM | POA: Diagnosis not present

## 2018-05-27 DIAGNOSIS — O09523 Supervision of elderly multigravida, third trimester: Secondary | ICD-10-CM | POA: Diagnosis not present

## 2018-05-27 DIAGNOSIS — Z3686 Encounter for antenatal screening for cervical length: Secondary | ICD-10-CM

## 2018-05-27 DIAGNOSIS — Z3480 Encounter for supervision of other normal pregnancy, unspecified trimester: Secondary | ICD-10-CM | POA: Insufficient documentation

## 2018-05-28 ENCOUNTER — Other Ambulatory Visit: Payer: Self-pay | Admitting: Family Medicine

## 2018-05-28 MED ORDER — PROGESTERONE MICRONIZED 200 MG PO CAPS: ORAL_CAPSULE | ORAL | 3 refills | Status: DC

## 2018-05-29 ENCOUNTER — Telehealth: Payer: Self-pay

## 2018-05-29 ENCOUNTER — Inpatient Hospital Stay (HOSPITAL_COMMUNITY)
Admission: AD | Admit: 2018-05-29 | Discharge: 2018-05-29 | Disposition: A | Discharge status: Home / Self Care | Payer: Medicaid Other | Attending: Obstetrics & Gynecology | Admitting: Obstetrics & Gynecology

## 2018-05-29 ENCOUNTER — Encounter (HOSPITAL_COMMUNITY): Payer: Self-pay | Admitting: *Deleted

## 2018-05-29 ENCOUNTER — Other Ambulatory Visit: Payer: Self-pay

## 2018-05-29 DIAGNOSIS — O26873 Cervical shortening, third trimester: Secondary | ICD-10-CM

## 2018-05-29 DIAGNOSIS — Z789 Other specified health status: Secondary | ICD-10-CM | POA: Diagnosis not present

## 2018-05-29 DIAGNOSIS — O09523 Supervision of elderly multigravida, third trimester: Secondary | ICD-10-CM | POA: Insufficient documentation

## 2018-05-29 DIAGNOSIS — Z3480 Encounter for supervision of other normal pregnancy, unspecified trimester: Secondary | ICD-10-CM

## 2018-05-29 DIAGNOSIS — Z3A31 31 weeks gestation of pregnancy: Secondary | ICD-10-CM | POA: Diagnosis not present

## 2018-05-29 DIAGNOSIS — O26879 Cervical shortening, unspecified trimester: Secondary | ICD-10-CM

## 2018-05-29 DIAGNOSIS — O368130 Decreased fetal movements, third trimester, not applicable or unspecified: Secondary | ICD-10-CM | POA: Diagnosis present

## 2018-05-29 DIAGNOSIS — Z79899 Other long term (current) drug therapy: Secondary | ICD-10-CM | POA: Insufficient documentation

## 2018-05-29 DIAGNOSIS — Z3689 Encounter for other specified antenatal screening: Secondary | ICD-10-CM

## 2018-05-29 HISTORY — DX: Anemia, unspecified: D64.9

## 2018-05-29 HISTORY — DX: Unspecified infectious disease: B99.9

## 2018-05-29 NOTE — Telephone Encounter (Signed)
Used interpreter [phone]  Discussed that it is very important for her to restart the progestrone. Explained we have sent it to the [facility] on [address] in [place]. Explained that once the medication has run out she needs to pick up a refill at her pharmacy.  Patient and husband states understanding.  Then patient states she hasnt been taking the iron because is causes her dizzines and stomach upset. Explained iron rich foods she could try and eat but also explained it is best to take iron on empty stomach and with OJ.   Re stressed at this point it is important for her to start taking the prometrium again. Patient states understanding and states that she did insert one last night. Patient states that the baby has not moved since then. Patient instructed if she has not felt baby move since yesterday evening she needs to go to the hospital now for evaluation.  Patient given address to [facility] and instructed her to go now. Patient husband states understanding.     (20 minutes spent on phone with assistance from interpreter). Armandina Stammer RN

## 2018-05-29 NOTE — MAU Provider Note (Signed)
History     CSN: [id number]  Arrival date and time: 05/29/18 1137   First Provider Initiated Contact with Patient 05/29/18 1255 - [employer] Video Interpreter Duwayne Heck #[id number]      Chief Complaint  Patient presents with  . Decreased Fetal Movement   HPI  Ms.  Sharon Powers is a 43 y.o. year old [id number] female at [redacted]w[redacted]d weeks gestation who presents to MAU reporting no FM since last night after 2200 when she inserted progesterone. She is worried the medication is having a side effect on the baby. She also complains of a "belly ache" when she takes her iron pills. She asks if she should continue taking the progesterone and iron pills.  Past Medical History:  Diagnosis Date  . Anemia   . Infection    UTI  . Language barrier    Mandarin [place]  . Miscarriage   . Preterm labor    short cervix  . SAB (spontaneous abortion)     Past Surgical History:  Procedure Laterality Date  . DILATION AND CURETTAGE OF UTERUS N/A 09/10/2016   Procedure: DILATATION AND CURETTAGE;  Surgeon: Huel Cote, MD;  Location: [facility];  Service: Gynecology;  Laterality: N/A;  . NO PAST SURGERIES      Family History  Problem Relation Age of Onset  . Healthy Mother   . Healthy Father   . Alcohol abuse Neg Hx   . Arthritis Neg Hx   . Asthma Neg Hx   . Birth defects Neg Hx   . Cancer Neg Hx   . COPD Neg Hx   . Depression Neg Hx   . Diabetes Neg Hx   . Drug abuse Neg Hx   . Early death Neg Hx   . Hearing loss Neg Hx   . Heart disease Neg Hx   . Hyperlipidemia Neg Hx   . Hypertension Neg Hx   . Kidney disease Neg Hx   . Learning disabilities Neg Hx   . Mental illness Neg Hx   . Mental retardation Neg Hx   . Miscarriages / Stillbirths Neg Hx   . Stroke Neg Hx   . Vision loss Neg Hx     Social History   Tobacco Use  . Smoking status: Never Smoker  . Smokeless tobacco: Never Used  Substance Use Topics  . Alcohol use: No  . Drug use: No    Allergies: No Known  Allergies  Medications Prior to Admission  Medication Sig Dispense Refill Last Dose  . ferrous sulfate (FERROUSUL) 325 (65 FE) MG tablet Take 1 tablet (325 mg total) by mouth 2 (two) times daily. 60 tablet 1 05/29/2018 at Unknown time  . progesterone (PROMETRIUM) 200 MG capsule Place one capsule vaginally at bedtime 30 capsule 3 05/28/2018 at Unknown time  . cephALEXin (KEFLEX) 500 MG capsule cephalexin 500 mg capsule   Not Taking  . cephALEXin (KEFLEX) 500 MG capsule Take 1 capsule (500 mg total) by mouth 3 (three) times daily. (Patient not taking: Reported on 05/27/2018) 21 capsule 0 Not Taking  . metroNIDAZOLE (METROGEL) 0.75 % vaginal gel metronidazole 0.75 % vaginal gel   Not Taking  . Prenatal Vit-Fe Fumarate-FA (PRENATAL MULTIVITAMIN) TABS tablet Take 1 tablet by mouth daily at 12 noon. (Patient not taking: Reported on 05/27/2018) 90 tablet 3 More than a month at Unknown time  . sodium chloride (OCEAN) 0.65 % SOLN nasal spray Place 1 spray into both nostrils as needed for congestion.   Not Taking  Review of Systems  Constitutional: Negative.   HENT: Negative.   Eyes: Negative.   Respiratory: Negative.   Cardiovascular: Negative.   Gastrointestinal: Negative.   Genitourinary:       DFM since last night until on the way to MAU, now feeling FM with no difficulties  Musculoskeletal: Negative.   Skin: Negative.   Allergic/Immunologic: Negative.   Neurological: Negative.   Hematological: Negative.   Psychiatric/Behavioral: Negative.    Physical Exam   Blood pressure 113/73, pulse (!) 114, temperature 98.1 F (36.7 C), temperature source Oral, resp. rate 18, weight 76.7 kg, last menstrual period 10/18/2017, SpO2 99 %, unknown if currently breastfeeding.  Physical Exam  Nursing note and vitals reviewed. Constitutional: She is oriented to person, place, and time. She appears well-developed and well-nourished.  HENT:  Head: Normocephalic and atraumatic.  Eyes: Pupils are equal, round,  and reactive to light.  Neck: Normal range of motion.  Cardiovascular: Normal rate.  Respiratory: Effort normal and breath sounds normal.  GI: Soft. Bowel sounds are normal.  Genitourinary:    Genitourinary Comments: Dilation: Closed Effacement (%): 50 Cervical Position: Posterior Station: Ballotable Presentation: Undeterminable Exam by: Carloyn Jaeger. Uzma Hellmer, CNM    Musculoskeletal: Normal range of motion.  Neurological: She is alert and oriented to person, place, and time. She has normal reflexes.  Skin: Skin is warm and dry.  Psychiatric: She has a normal mood and affect. Her behavior is normal. Judgment and thought content normal.    MAU Course  Procedures  MDM NST - FHR: 150 bpm / moderate variability / accels present / decels absent / TOCO: Occ. UC's every 10 mins   Assessment and Plan  NST (non-stress test) reactive - Plan: Discharge patient - Reassurance given that baby is doing well on monitor, lots of movements detected on tracing and audibly heard by provider and patient - [facility]   Language barrier affecting health care - [employer] Video Interpreter used  Short cervix, antepartum - Reassurance given that no cervical dilation has occurred with UC's today - Encouraged to continue using vaginal progesterone hs  - Keep scheduled appt with [facility] - Patient verbalized an understanding of the plan of care and agrees.   *[employer] video interpreter Bonita QuinLinda [phone] used for exam    Raelyn Moraolitta Kameria Canizares, MSN, CNM 05/29/2018, 12:55 PM

## 2018-05-29 NOTE — MAU Note (Signed)
Need to verify the heart. Yesterday around 1000, she put the progesterone inside the vagina, since then the baby doesn't move. No pain, no bleeding or leaking.

## 2018-06-12 ENCOUNTER — Ambulatory Visit (INDEPENDENT_AMBULATORY_CARE_PROVIDER_SITE_OTHER): Payer: Medicaid Other | Admitting: Family Medicine

## 2018-06-12 ENCOUNTER — Other Ambulatory Visit: Payer: Self-pay

## 2018-06-12 VITALS — BP 111/58 | HR 107 | Wt 173.0 lb

## 2018-06-12 DIAGNOSIS — Z758 Other problems related to medical facilities and other health care: Secondary | ICD-10-CM

## 2018-06-12 DIAGNOSIS — D649 Anemia, unspecified: Secondary | ICD-10-CM

## 2018-06-12 DIAGNOSIS — O26879 Cervical shortening, unspecified trimester: Secondary | ICD-10-CM

## 2018-06-12 DIAGNOSIS — Z3480 Encounter for supervision of other normal pregnancy, unspecified trimester: Secondary | ICD-10-CM

## 2018-06-12 DIAGNOSIS — O09523 Supervision of elderly multigravida, third trimester: Secondary | ICD-10-CM

## 2018-06-12 DIAGNOSIS — Z789 Other specified health status: Secondary | ICD-10-CM

## 2018-06-12 DIAGNOSIS — O26873 Cervical shortening, third trimester: Secondary | ICD-10-CM

## 2018-06-12 DIAGNOSIS — Z3A33 33 weeks gestation of pregnancy: Secondary | ICD-10-CM

## 2018-06-12 MED ORDER — FERROUS GLUCONATE 324 (38 FE) MG PO TABS: 324.0000 mg | ORAL_TABLET | Freq: Every day | ORAL | 3 refills | Status: AC

## 2018-06-12 NOTE — Progress Notes (Signed)
[employer] interpreter Leatha Gilding [phone]

## 2018-06-12 NOTE — Progress Notes (Signed)
   PRENATAL VISIT NOTE  Subjective:  Sharon Powers is a 43 y.o. [phone] at [redacted]w[redacted]d being seen today for ongoing prenatal care.  She is currently monitored for the following issues for this low-risk pregnancy and has Normochromic normocytic anemia; Thrombocytopenia (HCC); Abnormal uterine bleeding; Supervision of other normal pregnancy, antepartum; AMA (advanced maternal age) multigravida 35+; Short cervix, antepartum; and Language barrier affecting health care on their problem list.  Patient reports no complaints.  Contractions: Irritability. Vag. Bleeding: None.  Movement: Present. Denies leaking of fluid.   The following portions of the patient's history were reviewed and updated as appropriate: allergies, current medications, past family history, past medical history, past social history, past surgical history and problem list.   Objective:   Vitals:   06/12/18 0817  BP: (!) 111/58  Pulse: (!) 107  Weight: 173 lb 0.6 oz (78.5 kg)    Fetal Status: Fetal Heart Rate (bpm): 164 Fundal Height: 34 cm Movement: Present     General:  Alert, oriented and cooperative. Patient is in no acute distress.  Skin: Skin is warm and dry. No rash noted.   Cardiovascular: Normal heart rate noted  Respiratory: Normal respiratory effort, no problems with respiration noted  Abdomen: Soft, gravid, appropriate for gestational age.  Pain/Pressure: Present     Pelvic: Cervical exam deferred        Extremities: Normal range of motion.  Edema: Trace  Mental Status: Normal mood and affect. Normal behavior. Normal judgment and thought content.   Assessment and Plan:  Pregnancy: [id number] at [redacted]w[redacted]d  1. Supervision of other normal pregnancy, antepartum FHT and FH normal  2. Multigravida of advanced maternal age in third trimester No additional testing needed. Low risk NIPS  3. Language barrier affecting health care Interpreter used  4. Short cervix, antepartum On prometrium  5. Normochromic normocytic anemia  Change to ferrous gluconate as having stomach pains with ferrous sulfate.   Preterm labor symptoms and general obstetric precautions including but not limited to vaginal bleeding, contractions, leaking of fluid and fetal movement were reviewed in detail with the patient. Please refer to After Visit Summary for other counseling recommendations.   Return in about 3 weeks (around 07/03/2018) for OB f/u.  Future Appointments  Date Time Provider Department Center  06/25/2018 10:00 AM [facility]-M[facility] [place] 3 WH-MFCUS MFC-US  07/03/2018  8:15 AM Conan Bowens, MD [facility] None    Levie Heritage, DO

## 2018-06-25 ENCOUNTER — Encounter (HOSPITAL_COMMUNITY): Payer: Self-pay

## 2018-06-25 ENCOUNTER — Ambulatory Visit (HOSPITAL_COMMUNITY): Payer: Medicaid Other | Admitting: *Deleted

## 2018-06-25 ENCOUNTER — Ambulatory Visit (HOSPITAL_COMMUNITY)
Admission: RE | Admit: 2018-06-25 | Discharge: 2018-06-25 | Disposition: A | Discharge status: Home / Self Care | Payer: Medicaid Other | Source: Ambulatory Visit | Attending: Obstetrics and Gynecology | Admitting: Obstetrics and Gynecology

## 2018-06-25 ENCOUNTER — Other Ambulatory Visit (HOSPITAL_COMMUNITY): Payer: Self-pay | Admitting: Obstetrics and Gynecology

## 2018-06-25 ENCOUNTER — Other Ambulatory Visit (HOSPITAL_COMMUNITY): Payer: Self-pay | Admitting: *Deleted

## 2018-06-25 ENCOUNTER — Other Ambulatory Visit: Payer: Self-pay

## 2018-06-25 VITALS — BP 111/75 | HR 116 | Temp 98.6°F

## 2018-06-25 DIAGNOSIS — O09529 Supervision of elderly multigravida, unspecified trimester: Secondary | ICD-10-CM

## 2018-06-25 DIAGNOSIS — O09523 Supervision of elderly multigravida, third trimester: Secondary | ICD-10-CM | POA: Diagnosis not present

## 2018-06-25 DIAGNOSIS — Z3A35 35 weeks gestation of pregnancy: Secondary | ICD-10-CM

## 2018-06-25 DIAGNOSIS — Z362 Encounter for other antenatal screening follow-up: Secondary | ICD-10-CM

## 2018-06-25 DIAGNOSIS — O26873 Cervical shortening, third trimester: Secondary | ICD-10-CM

## 2018-06-25 NOTE — Progress Notes (Signed)
Mandarin interpreter [phone] utilized during visit.

## 2018-06-25 NOTE — Procedures (Signed)
Sharon Powers 10/29/1975 [redacted]w[redacted]d  Fetus A Non-Stress Test Interpretation for 06/25/18  Indication: Unsatisfactory BPP  Fetal Heart Rate A Mode: External Baseline Rate (A): 145 bpm Variability: Moderate Accelerations: 15 x 15 Decelerations: None Multiple birth?: No  Uterine Activity Mode: Palpation, Toco Contraction Frequency (min): x2 Contraction Duration (sec): 40-50 Contraction Quality: Mild(Pt denies feeling any) Resting Tone Palpated: Relaxed Resting Time: Adequate  Interpretation (Fetal Testing) Nonstress Test Interpretation: Reactive Comments: Reviewed tracing with Dr. Judeth Cornfield

## 2018-06-29 ENCOUNTER — Encounter (HOSPITAL_COMMUNITY): Payer: Self-pay

## 2018-06-29 ENCOUNTER — Other Ambulatory Visit: Payer: Self-pay

## 2018-06-29 ENCOUNTER — Ambulatory Visit (HOSPITAL_BASED_OUTPATIENT_CLINIC_OR_DEPARTMENT_OTHER): Payer: Medicaid Other | Admitting: *Deleted

## 2018-06-29 ENCOUNTER — Ambulatory Visit (HOSPITAL_COMMUNITY): Payer: Medicaid Other | Admitting: *Deleted

## 2018-06-29 ENCOUNTER — Ambulatory Visit (HOSPITAL_COMMUNITY)
Admission: RE | Admit: 2018-06-29 | Discharge: 2018-06-29 | Disposition: A | Discharge status: Home / Self Care | Payer: Medicaid Other | Source: Ambulatory Visit | Attending: Obstetrics and Gynecology | Admitting: Obstetrics and Gynecology

## 2018-06-29 ENCOUNTER — Other Ambulatory Visit (HOSPITAL_COMMUNITY): Payer: Self-pay | Admitting: Obstetrics and Gynecology

## 2018-06-29 ENCOUNTER — Other Ambulatory Visit (HOSPITAL_COMMUNITY): Payer: Self-pay | Admitting: *Deleted

## 2018-06-29 VITALS — BP 120/63 | HR 102 | Temp 98.2°F

## 2018-06-29 DIAGNOSIS — O09529 Supervision of elderly multigravida, unspecified trimester: Secondary | ICD-10-CM

## 2018-06-29 DIAGNOSIS — Z3A36 36 weeks gestation of pregnancy: Secondary | ICD-10-CM

## 2018-06-29 DIAGNOSIS — O26873 Cervical shortening, third trimester: Secondary | ICD-10-CM

## 2018-06-29 DIAGNOSIS — Z9289 Personal history of other medical treatment: Secondary | ICD-10-CM

## 2018-06-29 DIAGNOSIS — O09523 Supervision of elderly multigravida, third trimester: Secondary | ICD-10-CM

## 2018-06-29 DIAGNOSIS — O283 Abnormal ultrasonic finding on antenatal screening of mother: Secondary | ICD-10-CM

## 2018-06-29 NOTE — Procedures (Signed)
Sharon Powers August 30, 1975 [redacted]w[redacted]d  Fetus A Non-Stress Test Interpretation for 06/29/18  Indication: Unsatisfactory BPP  Fetal Heart Rate A Mode: External Baseline Rate (A): 155 bpm Variability: Moderate Accelerations: 15 x 15 Decelerations: None Multiple birth?: No  Uterine Activity Mode: Palpation, Toco Contraction Frequency (min): 1 UC Contraction Duration (sec): 90 Contraction Quality: Mild Resting Tone Palpated: Relaxed Resting Time: Adequate  Interpretation (Fetal Testing) Nonstress Test Interpretation: Reactive Comments: EFM tracing reviewed by Dr. Donalee Citrin

## 2018-06-30 ENCOUNTER — Ambulatory Visit (HOSPITAL_COMMUNITY)
Admission: RE | Admit: 2018-06-30 | Discharge: 2018-06-30 | Disposition: A | Discharge status: Home / Self Care | Payer: Medicaid Other | Source: Ambulatory Visit | Attending: Obstetrics and Gynecology | Admitting: Obstetrics and Gynecology

## 2018-06-30 ENCOUNTER — Ambulatory Visit (HOSPITAL_BASED_OUTPATIENT_CLINIC_OR_DEPARTMENT_OTHER): Payer: Medicaid Other | Admitting: *Deleted

## 2018-06-30 ENCOUNTER — Other Ambulatory Visit (HOSPITAL_COMMUNITY): Payer: Medicaid Other

## 2018-06-30 ENCOUNTER — Other Ambulatory Visit (HOSPITAL_COMMUNITY): Payer: Self-pay | Admitting: *Deleted

## 2018-06-30 ENCOUNTER — Ambulatory Visit (HOSPITAL_COMMUNITY): Payer: Medicaid Other | Admitting: *Deleted

## 2018-06-30 ENCOUNTER — Encounter (HOSPITAL_COMMUNITY): Payer: Self-pay

## 2018-06-30 VITALS — BP 117/70 | HR 97 | Temp 98.2°F

## 2018-06-30 DIAGNOSIS — Z3A36 36 weeks gestation of pregnancy: Secondary | ICD-10-CM

## 2018-06-30 DIAGNOSIS — O26873 Cervical shortening, third trimester: Secondary | ICD-10-CM

## 2018-06-30 DIAGNOSIS — Z3480 Encounter for supervision of other normal pregnancy, unspecified trimester: Secondary | ICD-10-CM | POA: Insufficient documentation

## 2018-06-30 DIAGNOSIS — O283 Abnormal ultrasonic finding on antenatal screening of mother: Secondary | ICD-10-CM | POA: Diagnosis not present

## 2018-06-30 DIAGNOSIS — O368130 Decreased fetal movements, third trimester, not applicable or unspecified: Secondary | ICD-10-CM

## 2018-06-30 DIAGNOSIS — Z9289 Personal history of other medical treatment: Secondary | ICD-10-CM | POA: Diagnosis not present

## 2018-06-30 DIAGNOSIS — O09523 Supervision of elderly multigravida, third trimester: Secondary | ICD-10-CM

## 2018-06-30 NOTE — Procedures (Signed)
Kayela Humphres [birth year] [redacted]w[redacted]d  Fetus A Non-Stress Test Interpretation for 06/30/18  Indication: 06/29/18 BPP 4/8, today 8/8  Fetal Heart Rate A Mode: External Baseline Rate (A): 150 bpm Variability: Moderate Accelerations: 15 x 15 Decelerations: None Multiple birth?: No  Uterine Activity Mode: Palpation, Toco Contraction Frequency (min): 1 UC with UI Contraction Duration (sec): 10-60 Contraction Quality: Mild Resting Tone Palpated: Relaxed Resting Time: Adequate  Interpretation (Fetal Testing) Nonstress Test Interpretation: Reactive Comments: EFM tracing reviewed by Dr. Donalee Citrin

## 2018-07-03 ENCOUNTER — Other Ambulatory Visit (HOSPITAL_COMMUNITY): Payer: Self-pay | Admitting: Obstetrics and Gynecology

## 2018-07-03 ENCOUNTER — Other Ambulatory Visit: Payer: Self-pay | Admitting: Advanced Practice Midwife

## 2018-07-03 ENCOUNTER — Ambulatory Visit (HOSPITAL_COMMUNITY): Payer: Medicaid Other | Admitting: *Deleted

## 2018-07-03 ENCOUNTER — Ambulatory Visit (INDEPENDENT_AMBULATORY_CARE_PROVIDER_SITE_OTHER): Payer: Medicaid Other | Admitting: Obstetrics and Gynecology

## 2018-07-03 ENCOUNTER — Ambulatory Visit (HOSPITAL_COMMUNITY)
Admission: RE | Admit: 2018-07-03 | Discharge: 2018-07-03 | Disposition: A | Discharge status: Home / Self Care | Payer: Medicaid Other | Source: Ambulatory Visit | Attending: Obstetrics and Gynecology | Admitting: Obstetrics and Gynecology

## 2018-07-03 ENCOUNTER — Other Ambulatory Visit: Payer: Self-pay

## 2018-07-03 ENCOUNTER — Telehealth (HOSPITAL_COMMUNITY): Payer: Self-pay | Admitting: *Deleted

## 2018-07-03 ENCOUNTER — Encounter (HOSPITAL_COMMUNITY): Payer: Self-pay | Admitting: *Deleted

## 2018-07-03 ENCOUNTER — Encounter: Payer: Self-pay | Admitting: Obstetrics and Gynecology

## 2018-07-03 ENCOUNTER — Other Ambulatory Visit (HOSPITAL_COMMUNITY)
Admission: RE | Admit: 2018-07-03 | Discharge: 2018-07-03 | Disposition: A | Discharge status: Home / Self Care | Payer: Medicaid Other | Source: Ambulatory Visit | Attending: Obstetrics and Gynecology | Admitting: Obstetrics and Gynecology

## 2018-07-03 VITALS — BP 123/79 | HR 102 | Wt 181.0 lb

## 2018-07-03 DIAGNOSIS — Z3480 Encounter for supervision of other normal pregnancy, unspecified trimester: Secondary | ICD-10-CM | POA: Diagnosis not present

## 2018-07-03 DIAGNOSIS — O09523 Supervision of elderly multigravida, third trimester: Secondary | ICD-10-CM | POA: Diagnosis not present

## 2018-07-03 DIAGNOSIS — O368130 Decreased fetal movements, third trimester, not applicable or unspecified: Secondary | ICD-10-CM | POA: Insufficient documentation

## 2018-07-03 DIAGNOSIS — O26879 Cervical shortening, unspecified trimester: Secondary | ICD-10-CM

## 2018-07-03 DIAGNOSIS — Z789 Other specified health status: Secondary | ICD-10-CM | POA: Diagnosis not present

## 2018-07-03 DIAGNOSIS — O4290 Premature rupture of membranes, unspecified as to length of time between rupture and onset of labor, unspecified weeks of gestation: Secondary | ICD-10-CM

## 2018-07-03 DIAGNOSIS — Z3A36 36 weeks gestation of pregnancy: Secondary | ICD-10-CM | POA: Diagnosis not present

## 2018-07-03 LAB — OB RESULTS CONSOLE GBS: GBS: POSITIVE

## 2018-07-03 NOTE — Progress Notes (Signed)
   PRENATAL VISIT NOTE  Subjective:  Sharon Powers is a 43 y.o. [phone] at [redacted]w[redacted]d being seen today for ongoing prenatal care.  She is currently monitored for the following issues for this high-risk pregnancy and has Normochromic normocytic anemia; Thrombocytopenia ([place]); Abnormal uterine bleeding; Supervision of other normal pregnancy, antepartum; AMA (advanced maternal age) multigravida 85+; Short cervix, antepartum; and Language barrier affecting health care on their problem list.  Patient reports decreased movement the last few days although he is moving today. Also reports leaking of fluid x 1 week, it is water, not discharge..  Contractions: Not present. Vag. Bleeding: None.  Movement: (!) Decreased. Denies leaking of fluid.   The following portions of the patient's history were reviewed and updated as appropriate: allergies, current medications, past family history, past medical history, past social history, past surgical history and problem list.   Objective:   Vitals:   07/03/18 0813  BP: 123/79  Pulse: (!) 102  Weight: 181 lb (82.1 kg)   Fetal Status: Fetal Heart Rate (bpm): NST-R   Movement: (!) Decreased     General:  Alert, oriented and cooperative. Patient is in no acute distress.  Skin: Skin is warm and dry. No rash noted.   Cardiovascular: Normal heart rate noted  Respiratory: Normal respiratory effort, no problems with respiration noted  Abdomen: Soft, gravid, appropriate for gestational age.  Pain/Pressure: Present     Pelvic: Cervical exam deferred       neg pool, neg fern  Extremities: Normal range of motion.  Edema: Trace  Mental Status: Normal mood and affect. Normal behavior. Normal judgment and thought content.   Assessment and Plan:  Pregnancy: [id number] at [redacted]w[redacted]d  1. Supervision of other normal pregnancy, antepartum - Culture, beta strep (group b only) - GC/Chlamydia probe amp ([place])not at [facility]  2. Multigravida of advanced maternal age in third trimester  - Patient has had decreased fetal movement over the last few weeks, has had BPPs 2x/week. Due to increased maternal age and decreased movement, her induction has been moved up to 38 weeks per MFM recommendation. If testing is non-reassuring, will move to 37 weeks. I reviewed this with the patient, she is agreeable to plan.  -IOL scheduled for 38 weeks today - orders placed today  3. Language barrier affecting health care Mandarin interpretor used  4. Amniotic fluid leaking Neg pool, neg fern Low suspicion for rupture of membranes, swab sent for infection   Preterm labor symptoms and general obstetric precautions including but not limited to vaginal bleeding, contractions, leaking of fluid and fetal movement were reviewed in detail with the patient. Please refer to After Visit Summary for other counseling recommendations.   Return in about 1 week (around 07/10/2018) for OB visit (MD), in person.  Future Appointments  Date Time Provider [place]  07/03/2018  2:45 PM [place] MFC-US  07/03/2018  2:45 PM [place] 2 WH-MFCUS MFC-US  07/07/2018  9:15 AM [place] MFC-US  07/07/2018  9:15 AM WH-MFC [place] 4 WH-MFCUS MFC-US  07/08/2018  3:00 PM Lavonia Drafts, MD [facility] None  07/10/2018  9:15 AM WH-MFC NURSE WH-MFC MFC-US  07/10/2018  9:15 AM WH-MFC [place] 4 WH-MFCUS MFC-US  07/11/2018  7:30 AM MC-LD SCHED ROOM [facility]-INDC None  08/14/2018 10:30 AM Lavonia Drafts, MD [facility] None    Sloan Leiter, MD

## 2018-07-03 NOTE — Progress Notes (Signed)
Interpreter #[id number]   Used for visit. Kathrene Alu RN

## 2018-07-03 NOTE — Procedures (Signed)
Sharon Powers December 18, 1975 [redacted]w[redacted]d  Fetus A Non-Stress Test Interpretation for 07/03/18  Indication: Decreased Fetal Movement  Fetal Heart Rate A Mode: External Baseline Rate (A): 150 bpm Variability: Moderate Accelerations: 15 x 15 Decelerations: None Multiple birth?: No  Uterine Activity Mode: Toco Contraction Frequency (min): one noted Contraction Duration (sec): 120 Contraction Quality: Mild Resting Tone Palpated: Relaxed Resting Time: Adequate  Interpretation (Fetal Testing) Nonstress Test Interpretation: Reactive Comments: FHR tracing rev'd by Dr. Donalee Citrin

## 2018-07-03 NOTE — Telephone Encounter (Signed)
[id number] interpreter number Preadmission screen

## 2018-07-06 ENCOUNTER — Telehealth: Payer: Self-pay

## 2018-07-06 ENCOUNTER — Other Ambulatory Visit: Payer: Self-pay

## 2018-07-06 ENCOUNTER — Inpatient Hospital Stay (HOSPITAL_COMMUNITY): Payer: Medicaid Other | Admitting: Anesthesiology

## 2018-07-06 ENCOUNTER — Encounter (HOSPITAL_COMMUNITY): Payer: Self-pay

## 2018-07-06 ENCOUNTER — Inpatient Hospital Stay (HOSPITAL_COMMUNITY)
Admission: AD | Admit: 2018-07-06 | Discharge: 2018-07-09 | DRG: 807 | Disposition: A | Discharge status: Home / Self Care | Payer: Medicaid Other | Attending: Obstetrics & Gynecology | Admitting: Obstetrics & Gynecology

## 2018-07-06 DIAGNOSIS — R739 Hyperglycemia, unspecified: Secondary | ICD-10-CM | POA: Diagnosis present

## 2018-07-06 DIAGNOSIS — O99810 Abnormal glucose complicating pregnancy: Secondary | ICD-10-CM

## 2018-07-06 DIAGNOSIS — O99824 Streptococcus B carrier state complicating childbirth: Secondary | ICD-10-CM | POA: Diagnosis present

## 2018-07-06 DIAGNOSIS — Z3689 Encounter for other specified antenatal screening: Secondary | ICD-10-CM | POA: Diagnosis not present

## 2018-07-06 DIAGNOSIS — Z3A37 37 weeks gestation of pregnancy: Secondary | ICD-10-CM

## 2018-07-06 DIAGNOSIS — O26879 Cervical shortening, unspecified trimester: Secondary | ICD-10-CM

## 2018-07-06 DIAGNOSIS — Z1159 Encounter for screening for other viral diseases: Secondary | ICD-10-CM | POA: Diagnosis not present

## 2018-07-06 DIAGNOSIS — O368130 Decreased fetal movements, third trimester, not applicable or unspecified: Secondary | ICD-10-CM | POA: Diagnosis not present

## 2018-07-06 DIAGNOSIS — O26893 Other specified pregnancy related conditions, third trimester: Secondary | ICD-10-CM | POA: Diagnosis present

## 2018-07-06 DIAGNOSIS — Z789 Other specified health status: Secondary | ICD-10-CM

## 2018-07-06 DIAGNOSIS — Z3490 Encounter for supervision of normal pregnancy, unspecified, unspecified trimester: Secondary | ICD-10-CM

## 2018-07-06 DIAGNOSIS — Z3480 Encounter for supervision of other normal pregnancy, unspecified trimester: Secondary | ICD-10-CM

## 2018-07-06 DIAGNOSIS — Z3A00 Weeks of gestation of pregnancy not specified: Secondary | ICD-10-CM | POA: Diagnosis not present

## 2018-07-06 LAB — GC/CHLAMYDIA PROBE AMP (~~LOC~~) NOT AT ARMC
Chlamydia: NEGATIVE
Neisseria Gonorrhea: NEGATIVE

## 2018-07-06 LAB — CBC
HCT: 32.2 % — ABNORMAL LOW (ref 36.0–46.0)
Hemoglobin: 10.7 g/dL — ABNORMAL LOW (ref 12.0–15.0)
MCH: 30.8 pg (ref 26.0–34.0)
MCHC: 33.2 g/dL (ref 30.0–36.0)
MCV: 92.8 fL (ref 80.0–100.0)
Platelets: 179 10*3/uL (ref 150–400)
RBC: 3.47 MIL/uL — ABNORMAL LOW (ref 3.87–5.11)
RDW: 15.6 % — ABNORMAL HIGH (ref 11.5–15.5)
WBC: 7.1 10*3/uL (ref 4.0–10.5)
nRBC: 0 % (ref 0.0–0.2)

## 2018-07-06 LAB — ABO/RH: ABO/RH(D): O POS

## 2018-07-06 LAB — TYPE AND SCREEN
ABO/RH(D): O POS
Antibody Screen: NEGATIVE

## 2018-07-06 LAB — SARS CORONAVIRUS 2: SARS Coronavirus 2: NOT DETECTED

## 2018-07-06 LAB — CULTURE, BETA STREP (GROUP B ONLY): Strep Gp B Culture: POSITIVE — AB

## 2018-07-06 MED ORDER — FENTANYL-BUPIVACAINE-NACL 0.5-0.125-0.9 MG/250ML-% EP SOLN
12.0000 mL/h | EPIDURAL | Status: DC | PRN
  Filled 2018-07-06: qty 250

## 2018-07-06 MED ORDER — ONDANSETRON HCL 4 MG/2ML IJ SOLN: 4.0000 mg | Freq: Four times a day (QID) | INTRAMUSCULAR | Status: DC | PRN

## 2018-07-06 MED ORDER — OXYCODONE-ACETAMINOPHEN 5-325 MG PO TABS: 2.0000 | ORAL_TABLET | ORAL | Status: DC | PRN

## 2018-07-06 MED ORDER — SODIUM CHLORIDE 0.9 % IV SOLN
2.0000 g | Freq: Once | INTRAVENOUS | Status: AC
  Administered 2018-07-06: 2 g via INTRAVENOUS
  Filled 2018-07-06: qty 2000

## 2018-07-06 MED ORDER — LACTATED RINGERS IV SOLN: 500.0000 mL | INTRAVENOUS | Status: DC | PRN

## 2018-07-06 MED ORDER — PHENYLEPHRINE 40 MCG/ML (10ML) SYRINGE FOR IV PUSH (FOR BLOOD PRESSURE SUPPORT): 80.0000 ug | PREFILLED_SYRINGE | INTRAVENOUS | Status: DC | PRN

## 2018-07-06 MED ORDER — SODIUM CHLORIDE 0.9 % IV SOLN
5.0000 10*6.[IU] | Freq: Once | INTRAVENOUS | Status: AC
  Administered 2018-07-06: 5 10*6.[IU] via INTRAVENOUS
  Filled 2018-07-06: qty 5

## 2018-07-06 MED ORDER — EPHEDRINE 5 MG/ML INJ: 10.0000 mg | INTRAVENOUS | Status: DC | PRN

## 2018-07-06 MED ORDER — OXYTOCIN BOLUS FROM INFUSION
500.0000 mL | Freq: Once | INTRAVENOUS | Status: AC
  Administered 2018-07-07: 500 mL via INTRAVENOUS

## 2018-07-06 MED ORDER — LACTATED RINGERS IV SOLN
500.0000 mL | Freq: Once | INTRAVENOUS | Status: AC
  Administered 2018-07-06: 500 mL via INTRAVENOUS

## 2018-07-06 MED ORDER — ACETAMINOPHEN 325 MG PO TABS: 650.0000 mg | ORAL_TABLET | ORAL | Status: DC | PRN

## 2018-07-06 MED ORDER — OXYTOCIN 40 UNITS IN NORMAL SALINE INFUSION - SIMPLE MED
2.5000 [IU]/h | INTRAVENOUS | Status: DC
  Administered 2018-07-07: 2.5 [IU]/h via INTRAVENOUS
  Filled 2018-07-06 (×2): qty 1000

## 2018-07-06 MED ORDER — SODIUM CHLORIDE (PF) 0.9 % IJ SOLN
INTRAMUSCULAR | Status: DC | PRN
  Administered 2018-07-06: 14 mL/h via EPIDURAL

## 2018-07-06 MED ORDER — DIPHENHYDRAMINE HCL 50 MG/ML IJ SOLN: 12.5000 mg | INTRAMUSCULAR | Status: DC | PRN

## 2018-07-06 MED ORDER — SOD CITRATE-CITRIC ACID 500-334 MG/5ML PO SOLN: 30.0000 mL | ORAL | Status: DC | PRN

## 2018-07-06 MED ORDER — LIDOCAINE HCL (PF) 1 % IJ SOLN
INTRAMUSCULAR | Status: DC | PRN
  Administered 2018-07-06: 10 mL via EPIDURAL

## 2018-07-06 MED ORDER — LACTATED RINGERS IV SOLN
INTRAVENOUS | Status: DC
  Administered 2018-07-06 – 2018-07-07 (×3): via INTRAVENOUS

## 2018-07-06 MED ORDER — FENTANYL CITRATE (PF) 100 MCG/2ML IJ SOLN: 50.0000 ug | INTRAMUSCULAR | Status: DC | PRN

## 2018-07-06 MED ORDER — LIDOCAINE HCL (PF) 1 % IJ SOLN: 30.0000 mL | INTRAMUSCULAR | Status: DC | PRN

## 2018-07-06 MED ORDER — PENICILLIN G 3 MILLION UNITS IVPB - SIMPLE MED
3.0000 10*6.[IU] | INTRAVENOUS | Status: DC
  Administered 2018-07-07: 3 10*6.[IU] via INTRAVENOUS
  Filled 2018-07-06: qty 100

## 2018-07-06 MED ORDER — PENICILLIN G 3 MILLION UNITS IVPB - SIMPLE MED: 3.0000 10*6.[IU] | INTRAVENOUS | Status: DC

## 2018-07-06 MED ORDER — OXYCODONE-ACETAMINOPHEN 5-325 MG PO TABS: 1.0000 | ORAL_TABLET | ORAL | Status: DC | PRN

## 2018-07-06 NOTE — Discharge Summary (Signed)
Postpartum Discharge Summary     Patient Name: Sharon Powers DOB: 11/21/1975 MRN: [id number]  Date of admission: 07/06/2018 Delivering Provider: Renee Harder   Date of discharge: 07/09/2018  Admitting diagnosis: ABD PAIN Intrauterine pregnancy: [redacted]w[redacted]d     Secondary diagnosis:  Active Problems:   Intrauterine pregnancy   Gestational hyperglycemia  Additional problems: none     Discharge diagnosis: Term Pregnancy Delivered                                                                                                Post partum procedures:None  Augmentation: AROM  Complications: None  Hospital course:  Onset of Labor With Vaginal Delivery     43 y.o. yo [id number] at [redacted]w[redacted]d was admitted in Active Labor on 07/06/2018. Patient had an uncomplicated labor course as follows:  Membrane Rupture Time/Date: 6:14 PM ,07/06/2018   Intrapartum Procedures: Episiotomy: None [1]                                         Lacerations:  2nd degree [3];Perineal [11]  Patient had a delivery of a Viable infant. Information for the patient's newborn:  Sharon, Powers [[id number]]       Pateint had an uncomplicated postpartum course.  She is ambulating, tolerating a regular diet, passing flatus, and urinating well. Patient is discharged home in stable condition on 07/09/18.   Magnesium Sulfate recieved: No BMZ received: No  Physical exam  Vitals:   07/07/18 2046 07/08/18 0520 07/08/18 1400 07/09/18 0525  BP: 129/74 122/77 131/77 121/72  Pulse: 81 78 75 77  Resp: 16 16 17 16   Temp: 98.4 F (36.9 C) 98.2 F (36.8 C) 98.2 F (36.8 C) 98.1 F (36.7 C)  TempSrc: Oral Oral Oral Oral  SpO2: 98%   98%  Weight:      Height:       General: alert, cooperative and no distress Lochia: appropriate Uterine Fundus: firm Incision: N/A DVT Evaluation: No evidence of DVT seen on physical exam. No cords or calf tenderness. No significant calf/ankle edema. Labs: Lab Results  Component Value Date   WBC 7.1 07/06/2018   HGB 10.7 (L) 07/06/2018   HCT 32.2 (L) 07/06/2018   MCV 92.8 07/06/2018   PLT 179 07/06/2018   CMP Latest Ref Rng & Units 03/21/2017  Glucose 65 - 99 mg/dL 95  BUN 6 - 20 mg/dL 10  Creatinine 0.44 - 1.00 mg/dL 0.48  Sodium 135 - 145 mmol/L 139  Potassium 3.5 - 5.1 mmol/L 3.6  Chloride 101 - 111 mmol/L 105  CO2 22 - 32 mmol/L 28  Calcium 8.9 - 10.3 mg/dL 8.3(L)  Total Protein 6.5 - 8.1 g/dL 7.2  Total Bilirubin 0.3 - 1.2 mg/dL 0.3  Alkaline Phos 38 - 126 U/L 48  AST 15 - 41 U/L 19  ALT 14 - 54 U/L 12(L)    Discharge instruction: per After Visit Summary and "Baby and Me Booklet".  After visit meds:  Allergies as  of 07/09/2018   No Known Allergies     Medication List    TAKE these medications   ferrous gluconate 324 MG tablet Commonly known as: FERGON Take 1 tablet (324 mg total) by mouth daily with breakfast.   ibuprofen 600 MG tablet Commonly known as: ADVIL Take 1 tablet (600 mg total) by mouth every 6 (six) hours.   prenatal multivitamin Tabs tablet Take 1 tablet by mouth daily at 12 noon.   sodium chloride 0.65 % Soln nasal spray Commonly known as: OCEAN Place 1 spray into both nostrils as needed for congestion.       Diet: routine diet  Activity: Advance as tolerated. Pelvic rest for 6 weeks.   Outpatient follow up:4 weeks Follow up Appt: Future Appointments  Date Time Provider Department Center  08/14/2018 10:30 AM Willodean RosenthalHarraway-Smith, Carolyn, MD [facility] None   Follow up Visit: Follow-up Information    [facility] [place] [facility] up on 08/14/2018.   Specialty: Obstetrics and Gynecology Why: As scheduled for your post partum visit Contact information: [address] [place] [phone]           Please schedule this patient for Postpartum visit in: 4 weeks with the following provider: Any provider For C/S patients schedule nurse incision check in  weeks 2 weeks: no High risk pregnancy complicated by: AMA, GHTN during labor Delivery mode:  SVD Anticipated Birth Control:  POPs - More likely OCPs since not breastfeeding, [facility]  PP Procedures needed: BP check  Schedule Integrated BH visit: no      Newborn Data: Live born adult  Birth Weight:   APGAR: ,   Newborn Delivery   Birth date/time:  Delivery type:       Baby Feeding: Bottle Disposition:home with mother, baby is on bili lights and may need to stay so mom will room-in depending on peds disposition for baby   07/09/2018 Vonzella NippleJulie Wenzel, PA-C

## 2018-07-06 NOTE — Progress Notes (Signed)
More vag d/c yesterday than today

## 2018-07-06 NOTE — MAU Provider Note (Signed)
History     CSN: [id number]  Arrival date and time: 07/06/18 1054   First Provider Initiated Contact with Patient 07/06/18 1202      Chief Complaint  Patient presents with  . Decreased Fetal Movement    currently feeling movement  . Contractions   [id number] @37 .2 wks presenting with abdominal "hardness". Sx started this am. Denies VB or LOF. Feeling some ctx, unsure how often. Reports decreased FM x2 days but feeling movement since arrival.    OB History    Gravida  4   Para  2   Term  2   Preterm  0   AB  1   Living  2     SAB  1   TAB  0   Ectopic  0   Multiple  0   Live Births  2           Past Medical History:  Diagnosis Date  . Anemia   . Infection    UTI  . Language barrier    Mandarin Congohinese  . Miscarriage   . Preterm labor    short cervix  . SAB (spontaneous abortion)     Past Surgical History:  Procedure Laterality Date  . DILATION AND CURETTAGE OF UTERUS N/A 09/10/2016   Procedure: DILATATION AND CURETTAGE;  Surgeon: Huel Coteichardson, Kathy, MD;  [facility];  Service: Gynecology;  Laterality: N/A;  . NO PAST SURGERIES      Family History  Problem Relation Age of Onset  . Healthy Mother   . Healthy Father   . Alcohol abuse Neg Hx   . Arthritis Neg Hx   . Asthma Neg Hx   . Birth defects Neg Hx   . Cancer Neg Hx   . COPD Neg Hx   . Depression Neg Hx   . Diabetes Neg Hx   . Drug abuse Neg Hx   . Early death Neg Hx   . Hearing loss Neg Hx   . Heart disease Neg Hx   . Hyperlipidemia Neg Hx   . Hypertension Neg Hx   . Kidney disease Neg Hx   . Learning disabilities Neg Hx   . Mental illness Neg Hx   . Mental retardation Neg Hx   . Miscarriages / Stillbirths Neg Hx   . Stroke Neg Hx   . Vision loss Neg Hx     Social History   Tobacco Use  . Smoking status: Never Smoker  . Smokeless tobacco: Never Used  Substance Use Topics  . Alcohol use: No  . Drug use: No    Allergies: No Known  Allergies  Medications Prior to Admission  Medication Sig Dispense Refill Last Dose  . ferrous gluconate (FERGON) 324 MG tablet Take 1 tablet (324 mg total) by mouth daily with breakfast. (Patient not taking: Reported on 07/03/2018) 30 tablet 3   . Prenatal Vit-Fe Fumarate-FA (PRENATAL MULTIVITAMIN) TABS tablet Take 1 tablet by mouth daily at 12 noon. (Patient not taking: Reported on 05/27/2018) 90 tablet 3   . progesterone (PROMETRIUM) 200 MG capsule Place one capsule vaginally at bedtime (Patient not taking: Reported on 06/29/2018) 30 capsule 3   . sodium chloride (OCEAN) 0.65 % SOLN nasal spray Place 1 spray into both nostrils as needed for congestion.       Review of Systems  Gastrointestinal: Positive for abdominal pain.  Genitourinary: Negative for vaginal bleeding and vaginal discharge.   Physical Exam   Blood pressure 113/89, pulse 96,  resp. rate (!) 22, height 5\' 5"  (1.651 m), weight 81.4 kg, last menstrual period 10/18/2017, SpO2 98 %, not currently breastfeeding.  Physical Exam  Nursing note and vitals reviewed. Constitutional: She is oriented to person, place, and time. She appears well-developed and well-nourished. No distress.  HENT:  Head: Normocephalic and atraumatic.  Neck: Normal range of motion.  Respiratory: Effort normal. No respiratory distress.  GI: Soft. She exhibits no distension. There is no abdominal tenderness.  gravid  Genitourinary:    Genitourinary Comments: SVE: 5/80/-1, vtx   Musculoskeletal: Normal range of motion.  Neurological: She is alert and oriented to person, place, and time.  Skin: Skin is warm and dry.  Psychiatric: She has a normal mood and affect.  EFM: 145 bpm, mod variability, + accels, no decels Toco: 4-7  No results found for this or any previous visit (from the past 24 hour(s)).  MAU Course  Procedures  MDM Irregular ctx but 5cm, will watch for signs of labor 1340: Rechecked, now 6/90/-1 w/BBOW, plan for admit, Dr. Maudie Mercury notified.    Assessment and Plan  [redacted] weeks gestation Active labor Admit to LD Mngt per labor team  Mandarin video interpreter used  Julianne Handler, CNM 07/06/2018, 12:14 PM

## 2018-07-06 NOTE — Telephone Encounter (Signed)
Amy Jacqulyn Liner called stating she was speaking with the patient about Covid testing since she is scheduled for induction on Saturday June 20.  Patient let Amy know she would not have a support person with her on Saturday and wondering if her induction could be moved to Sunday.  Patient then told Amy that she is having decreased fetal movements so Amy sent her to MAU for testing. Will check later today to see if moving induction back a day is feasible after she has been evaluated at MAU for current complaints. Kathrene Alu RN

## 2018-07-06 NOTE — Pre-Procedure Instructions (Signed)
[phone] interpreter number

## 2018-07-06 NOTE — Progress Notes (Addendum)
Pt states that she has minimal fetal movement and is receiving every U/S every 3 days currently. Pt states she is feeling "tightness" in her abdomen. Pt states vaginal discharge is white/clear and is mostly thin, slightly thicker since last night. Marry Guan

## 2018-07-06 NOTE — Telephone Encounter (Signed)
Patient is currently admitted for labor. Kathrene Alu RN

## 2018-07-06 NOTE — Progress Notes (Signed)
Interpreter used during provider exam and to explain visitation policy. Pt encouraged to call support person. InterpreterVaughan Basta, [place]  # [phone]. Marry Guan

## 2018-07-06 NOTE — Pre-Procedure Instructions (Signed)
C/o abdomen stays hard most of the time.  Still feels baby move but not often.  Instructed to come to MAU for evaluation. Sam CNM notified

## 2018-07-06 NOTE — H&P (Addendum)
OBSTETRIC ADMISSION HISTORY AND PHYSICAL  Subective Sharon Powers is a 43 y.o. female [phone] with IUP at [redacted]w[redacted]d by LMP = [place] admitted for active labor (IOL scheduled for 07/11/18).  Reports fetal movement. Denies vaginal bleeding and ROM. She received her prenatal care at [facility].  Support person in labor:   Ultrasounds . 11w1 - dating  . 18w2 - wnl . 22w3 - complete anatomy + cervical length 2.2 cm . 31w4 EFW 2028g, 76%ile + Cervical length 1.2 cm  Prenatal History/Complications: . AMA . Language barrier . Short cervix  Past Medical History:  Diagnosis Date  . Anemia   . Infection    UTI  . Language barrier    Mandarin [place]  . Miscarriage   . Preterm labor    short cervix  . SAB (spontaneous abortion)    Past Surgical History:  Procedure Laterality Date  . DILATION AND CURETTAGE OF UTERUS N/A 09/10/2016   Procedure: DILATATION AND CURETTAGE;  Surgeon: Paula Compton, MD;  Location: [facility];  Service: Gynecology;  Laterality: N/A;  . NO PAST SURGERIES     OB History    Gravida  4   Para  2   Term  2   Preterm  0   AB  1   Living  2     SAB  1   TAB  0   Ectopic  0   Multiple  0   Live Births  2          Social History   Socioeconomic History  . Marital status: Married    Spouse name: Not on file  . Number of children: Not on file  . Years of education: Not on file  . Highest education level: Not on file  Occupational History  . Not on file  Social Needs  . Financial resource strain: Not hard at all  . Food insecurity    Worry: Never true    Inability: Never true  . Transportation needs    Medical: No    Non-medical: Not on file  Tobacco Use  . Smoking status: Never Smoker  . Smokeless tobacco: Never Used  Substance and Sexual Activity  . Alcohol use: No  . Drug use: No  . Sexual activity: Not Currently    Birth control/protection: None  Lifestyle  . Physical activity    Days per week: Not on file    Minutes  per session: Not on file  . Stress: Only a little  Relationships  . Social Herbalist on phone: Not on file    Gets together: Not on file    Attends religious service: Not on file    Active member of club or organization: Not on file    Attends meetings of clubs or organizations: Not on file    Relationship status: Not on file  Other Topics Concern  . Not on file  Social History Narrative   ** Merged History Encounter **       Family History  Problem Relation Age of Onset  . Healthy Mother   . Healthy Father   . Alcohol abuse Neg Hx   . Arthritis Neg Hx   . Asthma Neg Hx   . Birth defects Neg Hx   . Cancer Neg Hx   . COPD Neg Hx   . Depression Neg Hx   . Diabetes Neg Hx   . Drug abuse Neg Hx   . Early death Neg  Hx   . Hearing loss Neg Hx   . Heart disease Neg Hx   . Hyperlipidemia Neg Hx   . Hypertension Neg Hx   . Kidney disease Neg Hx   . Learning disabilities Neg Hx   . Mental illness Neg Hx   . Mental retardation Neg Hx   . Miscarriages / Stillbirths Neg Hx   . Stroke Neg Hx   . Vision loss Neg Hx    Allergies: No Known Allergies Medications:  No outpatient medications have been marked as taking for the 07/06/18 encounter [facility](Hospital Encounter).    Review of Systems  All systems reviewed and negative except as stated in HPI  Objective Physical Exam:  BP 113/89 (BP Location: Right Arm)   Pulse 96   Temp 98.5 F (36.9 C) (Oral)   Resp (!) 22   Ht 5\' 5"  (1.651 m)   Wt 81.4 kg   LMP 10/18/2017   SpO2 98%   Breastfeeding No   BMI 29.86 kg/m  General appearance: alert, cooperative and no distress Lungs: no respiratory distress Heart: RRR. No BLEE.  Abdomen: soft, non-tender; gravid  Pelvic: deferred Extremities: Moving spontaneously, warm, well perfused. 2+ DP. Uterine activity: irregular Cervical Exam:  Dilation: 6 Effacement (%): 90 Exam by:: Shirlee LatchM. Bhanbri, CNM Presentation: cephalic Fetal monitoring: baseline 145 / mod variability/ -a  / -d  Prenatal labs: ABO, Rh: O/Positive/-- (01/02 1529) Antibody: Negative (01/02 1529) Rubella: 2.76 (01/02 1529) RPR: Non Reactive (04/23 1115)  HBsAg: Negative (01/02 1529)  HIV: Non Reactive (04/23 1115)  GBS: Positive (06/12 0000)  Glucola: 118/88/112 Genetic screening:  NIPS low risk female, AFP wnl  Prenatal Transfer Tool  Maternal Diabetes: No Genetic Screening: Normal Maternal Ultrasounds/Referrals: Normal Fetal Ultrasounds or other Referrals:  None Maternal Substance Abuse:  No Significant Maternal Medications:  None Significant Maternal Lab Results: Group B Strep positive  No results found for this or any previous visit (from the past 24 hour(s)).  Patient Active Problem List   Diagnosis Date Noted  . Language barrier affecting health care 03/26/2018  . Short cervix, antepartum 03/25/2018  . Supervision of other normal pregnancy, antepartum 01/22/2018  . AMA (advanced maternal age) multigravida 35+ 01/22/2018  . Abnormal uterine bleeding 05/03/2017  . Normochromic normocytic anemia 04/22/2016  . Thrombocytopenia (HCC) 04/22/2016    Assessment & Plan:  Sharon Powers is a 43 y.o. [phone] at 6540w2d - Spontaneous labor, progressing normally  Labor: Cervical change in MAU from 5cm to 6cm in ~one hour.  . Pain control: Epidural . Anticipated MOD: NSVD  Fetal Wellbeing: Cephalic by CE.  Category I tracing. . GBS: Positive (06/12 0000) - ampicillin . Continuous fetal monitoring  Language Barrier . Translator on tablet  Postpartum Planning . Circ: outpatient / bottle / Contraception: POPs  . RI, [x]  Tdap   Genia Hotterachel Kim, M.D.  Family Medicine  PGY-1 07/06/2018 2:08 PM  I personally was present during the history, physical exam and medical decision-making activities of this service and have verified that the service and findings are accurately documented in the resident's note.   Clayton BiblesSamantha Mandell Pangborn Certified Nurse Midwife Faculty Practice 07/06/18 3:17 PM

## 2018-07-06 NOTE — Progress Notes (Signed)
Arrived to L&D, pt to room 214. Report called by Sharon Miller, RN. Pt placed on monitors. This RN at [facility] until L&D RN able to arrive to begin care. Sharon Powers

## 2018-07-06 NOTE — Progress Notes (Signed)
OB/GYN Faculty Practice: Labor Progress Note  Subjective:  Doing well. No complaints   Objective:  BP 131/76 (BP Location: Right Arm)   Pulse 88   Temp 98.3 F (36.8 C) (Oral)   Resp 16   Ht 5\' 5"  (1.651 m)   Wt 81.4 kg   LMP 10/18/2017   SpO2 98%   Breastfeeding No   BMI 29.86 kg/m  Gen: Sitting in bed comfortably  Extremities: Moving spontaneously. No swelling, erythema.  CE: Dilation: 7 Effacement (%): 80 Station: -2 Presentation: Vertex Exam by:: Johny Shock, CNM  Assessment and Plan:  Sharon Powers is a 43 y.o. [phone] at [redacted]w[redacted]d - Spontaneous labor, progressing normally  Labor: Progressing normally. AROM at 1830. 8cm/ 8%/ -1.  . Pain control: Epidural . Anticipated MOD: NSVD . PPH Risk: low   Fetal Wellbeing: EFW 31w4 76%ile. BL 140, mod var, + a, -d. Cat I tracing,  . GBS Positive (06/12 0000)  . Continuous fetal monitoring  Language Barrier . Translator on tablet  Zettie Cooley, M.D.  Family Medicine  PGY-1 07/06/2018 6:59 PM

## 2018-07-06 NOTE — Anesthesia Preprocedure Evaluation (Signed)

## 2018-07-06 NOTE — Anesthesia Procedure Notes (Signed)
Epidural Patient location during procedure: OB Start time: 07/06/2018 8:36 PM End time: 07/06/2018 8:47 PM  Staffing Anesthesiologist: Lidia Collum, MD Performed: anesthesiologist   Preanesthetic Checklist Completed: patient identified, pre-op evaluation, timeout performed, IV checked, risks and benefits discussed and monitors and equipment checked  Epidural Patient position: sitting Prep: DuraPrep Patient monitoring: heart rate, continuous pulse ox and blood pressure Approach: midline Location: L3-L4 Injection technique: LOR air  Needle:  Needle type: Tuohy  Needle gauge: 17 G Needle length: 9 cm Needle insertion depth: 5 cm Catheter type: closed end flexible Catheter size: 19 Gauge Catheter at skin depth: 10 cm Test dose: negative  Assessment Events: blood not aspirated, injection not painful, no injection resistance, negative IV test and no paresthesia  Additional Notes Reason for block:procedure for pain

## 2018-07-07 ENCOUNTER — Ambulatory Visit (HOSPITAL_COMMUNITY): Payer: Medicaid Other

## 2018-07-07 ENCOUNTER — Encounter (HOSPITAL_COMMUNITY): Payer: Self-pay

## 2018-07-07 ENCOUNTER — Ambulatory Visit (HOSPITAL_COMMUNITY)
Admission: RE | Admit: 2018-07-07 | Discharge: 2018-07-07 | Disposition: A | Discharge status: Home / Self Care | Payer: Medicaid Other | Source: Ambulatory Visit | Attending: Obstetrics and Gynecology | Admitting: Obstetrics and Gynecology

## 2018-07-07 DIAGNOSIS — Z3689 Encounter for other specified antenatal screening: Secondary | ICD-10-CM

## 2018-07-07 DIAGNOSIS — Z3A37 37 weeks gestation of pregnancy: Secondary | ICD-10-CM

## 2018-07-07 DIAGNOSIS — O99810 Abnormal glucose complicating pregnancy: Secondary | ICD-10-CM

## 2018-07-07 DIAGNOSIS — O99824 Streptococcus B carrier state complicating childbirth: Secondary | ICD-10-CM

## 2018-07-07 LAB — RPR: RPR Ser Ql: NONREACTIVE

## 2018-07-07 MED ORDER — IBUPROFEN 600 MG PO TABS
600.0000 mg | ORAL_TABLET | Freq: Four times a day (QID) | ORAL | Status: DC
  Administered 2018-07-07 – 2018-07-09 (×11): 600 mg via ORAL
  Filled 2018-07-07 (×11): qty 1

## 2018-07-07 MED ORDER — SIMETHICONE 80 MG PO CHEW: 80.0000 mg | CHEWABLE_TABLET | ORAL | Status: DC | PRN

## 2018-07-07 MED ORDER — PRENATAL MULTIVITAMIN CH
1.0000 | ORAL_TABLET | Freq: Every day | ORAL | Status: DC
  Administered 2018-07-07 – 2018-07-09 (×3): 1 via ORAL
  Filled 2018-07-07 (×3): qty 1

## 2018-07-07 MED ORDER — DIBUCAINE (PERIANAL) 1 % EX OINT: 1.0000 "application " | TOPICAL_OINTMENT | CUTANEOUS | Status: DC | PRN

## 2018-07-07 MED ORDER — OXYCODONE-ACETAMINOPHEN 5-325 MG PO TABS: 2.0000 | ORAL_TABLET | ORAL | Status: DC | PRN

## 2018-07-07 MED ORDER — ZOLPIDEM TARTRATE 5 MG PO TABS: 5.0000 mg | ORAL_TABLET | Freq: Every evening | ORAL | Status: DC | PRN

## 2018-07-07 MED ORDER — COCONUT OIL OIL: 1.0000 "application " | TOPICAL_OIL | Status: DC | PRN

## 2018-07-07 MED ORDER — SENNOSIDES-DOCUSATE SODIUM 8.6-50 MG PO TABS
2.0000 | ORAL_TABLET | ORAL | Status: DC
  Administered 2018-07-08 (×2): 2 via ORAL
  Filled 2018-07-07 (×2): qty 2

## 2018-07-07 MED ORDER — OXYCODONE-ACETAMINOPHEN 5-325 MG PO TABS: 1.0000 | ORAL_TABLET | ORAL | Status: DC | PRN

## 2018-07-07 MED ORDER — DIPHENHYDRAMINE HCL 25 MG PO CAPS: 25.0000 mg | ORAL_CAPSULE | Freq: Four times a day (QID) | ORAL | Status: DC | PRN

## 2018-07-07 MED ORDER — BENZOCAINE-MENTHOL 20-0.5 % EX AERO
1.0000 "application " | INHALATION_SPRAY | CUTANEOUS | Status: DC | PRN
  Administered 2018-07-07: 1 via TOPICAL
  Filled 2018-07-07: qty 56

## 2018-07-07 MED ORDER — ONDANSETRON HCL 4 MG PO TABS: 4.0000 mg | ORAL_TABLET | ORAL | Status: DC | PRN

## 2018-07-07 MED ORDER — ACETAMINOPHEN 325 MG PO TABS: 650.0000 mg | ORAL_TABLET | ORAL | Status: DC | PRN

## 2018-07-07 MED ORDER — WITCH HAZEL-GLYCERIN EX PADS: 1.0000 "application " | MEDICATED_PAD | CUTANEOUS | Status: DC | PRN

## 2018-07-07 MED ORDER — ONDANSETRON HCL 4 MG/2ML IJ SOLN: 4.0000 mg | INTRAMUSCULAR | Status: DC | PRN

## 2018-07-07 NOTE — Progress Notes (Signed)
[name] interpretor [phone]

## 2018-07-07 NOTE — Anesthesia Postprocedure Evaluation (Signed)
Anesthesia Post Note  Patient: Sharon Powers  Procedure(s) Performed: AN AD Marcus     Patient location during evaluation: Mother Baby Anesthesia Type: Epidural Level of consciousness: awake and alert Pain management: pain level controlled Vital Signs Assessment: post-procedure vital signs reviewed and stable Respiratory status: spontaneous breathing, nonlabored ventilation and respiratory function stable Cardiovascular status: stable Postop Assessment: no headache, no backache and epidural receding Anesthetic complications: no    Last Vitals:  Vitals:   07/07/18 0615 07/07/18 0630  BP: 130/73 (!) 143/74  Pulse: 83 85  Resp:    Temp:    SpO2:      Last Pain:  Vitals:   07/07/18 0615  TempSrc:   PainSc: 0-No pain   Pain Goal: Patients Stated Pain Goal: 7 (07/06/18 2230)                 Gilmer Mor

## 2018-07-07 NOTE — Progress Notes (Signed)
Vitals:   07/06/18 2337 07/07/18 0000  BP:  140/83  Pulse:  92  Resp: 17   Temp: 98.2 F (36.8 C)   SpO2:     No c/o pressure. Cx now C/C/0-+1/  ? OP.Tried pushing, but not effective.  FHR Cat 1, ctx q 2-3 minutes.  Will labor down in exaggerated sims w/peanut

## 2018-07-07 NOTE — Progress Notes (Addendum)
[employer] interpreter Andee Poles [phone] used to introduce self to patient, ask if she wanted to order food (declined as tray just arrived), discussed pain scale and give ibuprofen, update baby's input/output, encouraged use of bra since she is bottle feeding, and discussed going to the bathroom

## 2018-07-07 NOTE — Lactation Note (Signed)
This note was copied from a baby's chart. Lactation Consultation Note  Patient Name: Boy Connelly Spruell GAYGE'F Date: 07/07/2018    [facility] Initial Visit:  Per RN and note, mother's feeding preference is bottle feeding.  She is a [facility] participant and only wants to use Enfamil.  She does not need lactation services.                Charlei Ramsaran R Makynli Stills 07/07/2018, 2:10 PM

## 2018-07-07 NOTE — Progress Notes (Signed)
Sharon Powers #[id number] stratus video interpretor

## 2018-07-08 ENCOUNTER — Other Ambulatory Visit (HOSPITAL_COMMUNITY)
Admission: RE | Admit: 2018-07-08 | Discharge: 2018-07-08 | Disposition: A | Discharge status: Home / Self Care | Payer: Medicaid Other | Source: Ambulatory Visit | Attending: Family Medicine | Admitting: Family Medicine

## 2018-07-08 ENCOUNTER — Encounter: Payer: Medicaid Other | Admitting: Obstetrics & Gynecology

## 2018-07-08 NOTE — Progress Notes (Signed)
[employer] interpreter Murray Hodgkins [phone]) used to discuss today's plan of care. Breakfast was ordered by previous RN but patient informed to let me know if she wants a snack or any food so that I can order. Pain assessed and baby's input/output recorded. Patient still undecided about circumcision. She is talking with her husband. Patient knows about fee and about paying beforehand if she requests on for baby. Patient still undecided about pediatrician. RN informed patient that we will go over pediatrician list today for her to pick one for baby. All of mom's questions were answered, trash emptied, and supplies given that were requested

## 2018-07-08 NOTE — Progress Notes (Signed)
[employer] interpreter Geryl Councilman (forgot to write down ID number) used to discuss circumcision payment, update feeds, order lunch and to initiate phototherapy.

## 2018-07-08 NOTE — Progress Notes (Signed)
Post Partum Day 1 Subjective: no complaints, up ad lib, voiding and tolerating PO  Objective: Blood pressure 122/77, pulse 78, temperature 98.2 F (36.8 C), temperature source Oral, resp. rate 16, height 5\' 5"  (1.651 m), weight 81.4 kg, last menstrual period 10/18/2017, SpO2 98 %, unknown if currently breastfeeding.  Physical Exam:  General: alert, cooperative and no distress Lochia: appropriate Uterine Fundus: firm Incision: n/a DVT Evaluation: No evidence of DVT seen on physical exam. Negative Homan's sign. No cords or calf tenderness. No significant calf/ankle edema.  Recent Labs    07/06/18 1426  HGB 10.7*  HCT 32.2*    Assessment/Plan: Plan for discharge tomorrow, Breastfeeding and Lactation consult   LOS: 2 days   Truett Mainland 07/08/2018, 7:24 AM

## 2018-07-08 NOTE — Progress Notes (Signed)
[employer] interpreter [phone] used to round on patient. Does not want to order food at this time. Had questions about circumcision that RN will follow up on

## 2018-07-08 NOTE — Progress Notes (Signed)
Interpreter Andee Poles [phone] used to complete assessment on patient. Plan of care, infant safety, and pt safety all discussed with pt; pt verbalized understanding. No c/o pain at this time. Utilized interpreter to ask if pt had any questions or concerns. Pt left in bed comfortably; will continue to monitor pt and use interpreter as needed.

## 2018-07-09 MED ORDER — IBUPROFEN 600 MG PO TABS: 600.0000 mg | ORAL_TABLET | Freq: Four times a day (QID) | ORAL | 0 refills | Status: AC

## 2018-07-09 NOTE — Discharge Instructions (Signed)
Contraception Choices °Contraception, also called birth control, means things to use or ways to try not to get pregnant. °Hormonal birth control °This kind of birth control uses hormones. Here are some types of hormonal birth control: °· A tube that is put under skin of the arm (implant). The tube can stay in for as long as 3 years. °· Shots to get every 3 months (injections). °· Pills to take every day (birth control pills). °· A patch to change 1 time each week for 3 weeks (birth control patch). After that, the patch is taken off for 1 week. °· A ring to put in the vagina. The ring is left in for 3 weeks. Then it is taken out of the vagina for 1 week. Then a new ring is put in. °· Pills to take after unprotected sex (emergency birth control pills). °Barrier birth control °Here are some types of barrier birth control: °· A thin covering that is put on the penis before sex (female condom). The covering is thrown away after sex. °· A soft, loose covering that is put in the vagina before sex (female condom). The covering is thrown away after sex. °· A rubber bowl that sits over the cervix (diaphragm). The bowl must be made for you. The bowl is put into the vagina before sex. The bowl is left in for 6-8 hours after sex. It is taken out within 24 hours. °· A small, soft cup that fits over the cervix (cervical cap). The cup must be made for you. The cup can be left in for 6-8 hours after sex. It is taken out within 48 hours. °· A sponge that is put into the vagina before sex. It must be left in for at least 6 hours after sex. It must be taken out within 30 hours. Then it is thrown away. °· A chemical that kills or stops sperm from getting into the uterus (spermicide). It may be a pill, cream, jelly, or foam to put in the vagina. The chemical should be used at least 10-15 minutes before sex. °IUD (intrauterine) birth control °An IUD is a small, T-shaped piece of plastic. It is put inside the uterus. There are two  kinds: °· Hormone IUD. This kind can stay in for 3-5 years. °· Copper IUD. This kind can stay in for 10 years. °Permanent birth control °Here are some types of permanent birth control: °· Surgery to block the fallopian tubes. °· Having an insert put into each fallopian tube. °· Surgery to tie off the tubes that carry sperm (vasectomy). °Natural planning birth control °Here are some types of natural planning birth control: °· Not having sex on the days the woman could get pregnant. °· Using a calendar: °? To keep track of the length of each period. °? To find out what days pregnancy can happen. °? To plan to not have sex on days when pregnancy can happen. °· Watching for symptoms of ovulation and not having sex during ovulation. One way the woman can check for ovulation is to check her temperature. °· Waiting to have sex until after ovulation. °Summary °· Contraception, also called birth control, means things to use or ways to try not to get pregnant. °· Hormonal methods of birth control include implants, injections, pills, patches, vaginal rings, and emergency birth control pills. °· Barrier methods of birth control can include female condoms, female condoms, diaphragms, cervical caps, sponges, and spermicides. °· There are two types of IUD (intrauterine device) birth control.   An IUD can be put in a woman's uterus to prevent pregnancy for 3-5 years. °· Permanent sterilization can be done through a procedure for males, females, or both. °· Natural planning methods involve not having sex on the days when the woman could get pregnant. °This information is not intended to replace advice given to you by your health care provider. Make sure you discuss any questions you have with your health care provider. °Document Released: 11/04/2008 Document Revised: 08/14/2017 Document Reviewed: 01/18/2016 °Elsevier Interactive Patient Education © 2019 [employer] °Vaginal Delivery, Care After °Refer to this sheet in the next few  weeks. These instructions provide you with information about caring for yourself after vaginal delivery. Your health care provider may also give you more specific instructions. Your treatment has been planned according to current medical practices, but problems sometimes occur. Call your health care provider if you have any problems or questions. °What can I expect after the procedure? °After vaginal delivery, it is common to have: °· Some bleeding from your vagina. °· Soreness in your abdomen, your vagina, and the area of skin between your vaginal opening and your anus (perineum). °· Pelvic cramps. °· Fatigue. °Follow these instructions at home: °Medicines °· Take over-the-counter and prescription medicines only as told by your health care provider. °· If you were prescribed an antibiotic medicine, take it as told by your health care provider. Do not stop taking the antibiotic until it is finished. °Driving ° °· Do not drive or operate heavy machinery while taking prescription pain medicine. °· Do not drive for 24 hours if you received a sedative. °Lifestyle °· Do not drink alcohol. This is especially important if you are breastfeeding or taking medicine to relieve pain. °· Do not use tobacco products, including cigarettes, chewing tobacco, or e-cigarettes. If you need help quitting, ask your health care provider. °Eating and drinking °· Drink at least 8 eight-ounce glasses of water every day unless you are told not to by your health care provider. If you choose to breastfeed your baby, you may need to drink more water than this. °· Eat high-fiber foods every day. These foods may help prevent or relieve constipation. High-fiber foods include: °? Whole grain cereals and breads. °? Brown rice. °? Beans. °? Fresh fruits and vegetables. °Activity °· Return to your normal activities as told by your health care provider. Ask your health care provider what activities are safe for you. °· Rest as much as possible. Try to  rest or take a nap when your baby is sleeping. °· Do not lift anything that is heavier than your baby or 10 lb (4.5 kg) until your health care provider says that it is safe. °· Talk with your health care provider about when you can engage in sexual activity. This may depend on your: °? Risk of infection. °? Rate of healing. °? Comfort and desire to engage in sexual activity. °Vaginal Care °· If you have an episiotomy or a vaginal tear, check the area every day for signs of infection. Check for: °? More redness, swelling, or pain. °? More fluid or blood. °? Warmth. °? Pus or a bad smell. °· Do not use tampons or douches until your health care provider says this is safe. °· Watch for any blood clots that may pass from your vagina. These may look like clumps of dark red, brown, or black discharge. °General instructions °· Keep your perineum clean and dry as told by your health care provider. °· Wear loose,   comfortable clothing. °· Wipe from front to back when you use the toilet. °· Ask your health care provider if you can shower or take a bath. If you had an episiotomy or a perineal tear during labor and delivery, your health care provider may tell you not to take baths for a certain length of time. °· Wear a bra that supports your breasts and fits you well. °· If possible, have someone help you with household activities and help care for your baby for at least a few days after you leave the hospital. °· Keep all follow-up visits for you and your baby as told by your health care provider. This is important. °Contact a health care provider if: °· You have: °? Vaginal discharge that has a bad smell. °? Difficulty urinating. °? Pain when urinating. °? A sudden increase or decrease in the frequency of your bowel movements. °? More redness, swelling, or pain around your episiotomy or vaginal tear. °? More fluid or blood coming from your episiotomy or vaginal tear. °? Pus or a bad smell coming from your episiotomy or vaginal  tear. °? A fever. °? A rash. °? Little or no interest in activities you used to enjoy. °? Questions about caring for yourself or your baby. °· Your episiotomy or vaginal tear feels warm to the touch. °· Your episiotomy or vaginal tear is separating or does not appear to be healing. °· Your breasts are painful, hard, or turn red. °· You feel unusually sad or worried. °· You feel nauseous or you vomit. °· You pass large blood clots from your vagina. If you pass a blood clot from your vagina, save it to show to your health care provider. Do not flush blood clots down the toilet without having your health care provider look at them. °· You urinate more than usual. °· You are dizzy or light-headed. °· You have not breastfed at all and you have not had a menstrual period for 12 weeks after delivery. °· You have stopped breastfeeding and you have not had a menstrual period for 12 weeks after you stopped breastfeeding. °Get help right away if: °· You have: °? Pain that does not go away or does not get better with medicine. °? Chest pain. °? Difficulty breathing. °? Blurred vision or spots in your vision. °? Thoughts about hurting yourself or your baby. °· You develop pain in your abdomen or in one of your legs. °· You develop a severe headache. °· You faint. °· You bleed from your vagina so much that you fill two sanitary pads in one hour. °This information is not intended to replace advice given to you by your health care provider. Make sure you discuss any questions you have with your health care provider. °Document Released: 01/05/2000 Document Revised: 06/21/2015 Document Reviewed: 01/22/2015 °Elsevier Interactive Patient Education © 2019 Else[employer] Inc. ° °

## 2018-07-09 NOTE — Progress Notes (Signed)
This RN used Interpreter " [place]" # [phone] for discharge instructions. Significant other also present. Mandarin was language of choice.

## 2018-07-10 ENCOUNTER — Ambulatory Visit (HOSPITAL_COMMUNITY): Payer: Medicaid Other

## 2018-07-11 ENCOUNTER — Inpatient Hospital Stay (HOSPITAL_COMMUNITY): Payer: Medicaid Other

## 2018-07-11 ENCOUNTER — Inpatient Hospital Stay (HOSPITAL_COMMUNITY)
Admission: AD | Admit: 2018-07-11 | Payer: Medicaid Other | Source: Home / Self Care | Admitting: Obstetrics and Gynecology

## 2018-07-13 ENCOUNTER — Encounter: Payer: Medicaid Other | Admitting: Family Medicine

## 2018-07-15 ENCOUNTER — Other Ambulatory Visit (HOSPITAL_COMMUNITY): Payer: Medicaid Other

## 2018-07-21 ENCOUNTER — Encounter: Payer: Medicaid Other | Admitting: Advanced Practice Midwife

## 2018-08-14 ENCOUNTER — Ambulatory Visit (INDEPENDENT_AMBULATORY_CARE_PROVIDER_SITE_OTHER): Payer: Medicaid Other | Admitting: Obstetrics & Gynecology

## 2018-08-14 ENCOUNTER — Encounter: Payer: Self-pay | Admitting: Obstetrics & Gynecology

## 2018-08-14 ENCOUNTER — Other Ambulatory Visit: Payer: Self-pay

## 2018-08-14 DIAGNOSIS — Z1389 Encounter for screening for other disorder: Secondary | ICD-10-CM | POA: Diagnosis not present

## 2018-08-14 DIAGNOSIS — Z3009 Encounter for other general counseling and advice on contraception: Secondary | ICD-10-CM

## 2018-08-14 NOTE — Progress Notes (Signed)
Subjective:     Sharon Powers is a 43 y.o. female who presents for a postpartum visit. She is 5 weeks postpartum following a spontaneous vaginal delivery. I have fully reviewed the prenatal and intrapartum course. The delivery was at 41 gestational weeks. Outcome: spontaneous vaginal delivery. Anesthesia: epidural. Postpartum course has been uncomplicated. Baby's course has been uncomlpicated. Baby is feeding by bottle - Enfamil Pro . Bleeding no bleeding. Bowel function is normal. Bladder function is normal. Patient is not sexually active. Contraception method is OCP (estrogen/progesterone). Postpartum depression screening: negative.  The following portions of the patient's history were reviewed and updated as appropriate: allergies, current medications, past family history, past medical history, past social history, past surgical history and problem list.  Review of Systems Pertinent items are noted in HPI.   Objective:  BP 104/62   Pulse 85   Ht 5\' 6"  (1.676 m)   Wt 147 lb (66.7 kg)   LMP 10/18/2017   BMI 23.73 kg/m    LMP 10/18/2017        CONSTITUTIONAL: Well-developed, well-nourished female in no acute distress.  HENT:  Normocephalic, atraumatic EYES: Conjunctivae and EOM are normal. No scleral icterus.  NECK: Normal range of motion SKIN: Skin is warm and dry. No rash noted. Not diaphoretic.No pallor. [place]: Alert and oriented to person, place, and time. Normal coordination.  GU: EGBUS: no lesions; perineum intact.  Vagina: no blood in vault Cervix: no lesion; no mucopurulent d/c Uterus: small, mobile Adnexa: no masses; non tender     Assessment:     5 weeks postpartum exam.  H/o elevated Glc in labor. 2 hour GTT was WNL  Plan:    1. Contraception: OCP (estrogen/progesterone) 2. Follow up in: 3 months or as needed.   3. F/u fir annual in 1 year or sooner prn  Sharon Powers, M.D., Sharon Powers

## 2018-08-17 ENCOUNTER — Encounter: Payer: Self-pay | Admitting: Obstetrics & Gynecology

## 2018-08-17 MED ORDER — NORETHIN ACE-ETH ESTRAD-FE 1-20 MG-MCG(24) PO TABS: 1.0000 | ORAL_TABLET | Freq: Every day | ORAL | 11 refills | Status: AC

## 2019-03-13 IMAGING — US US OB TRANSVAGINAL
1 series · 14 of 28 positions shown · non-contrast
Comparison: None.

CLINICAL DATA: Pelvic pain and cramping. Vaginal bleeding for 1
6 weeks and 2 days pregnant based on the last menstrual period. Beta
HCG level is 53,722

EXAM:
OBSTETRIC <14 WK US AND TRANSVAGINAL OB US
TECHNIQUE: Both transabdominal and transvaginal ultrasound examinations were
performed for complete evaluation of the gestation as well as the
maternal uterus, adnexal regions, and pelvic cul-de-sac.
Transvaginal technique was performed to assess early pregnancy.

[Series 1: us ob transvaginal · 0.22mm/px · 14 of 80 slices shown]
[im 3/80]
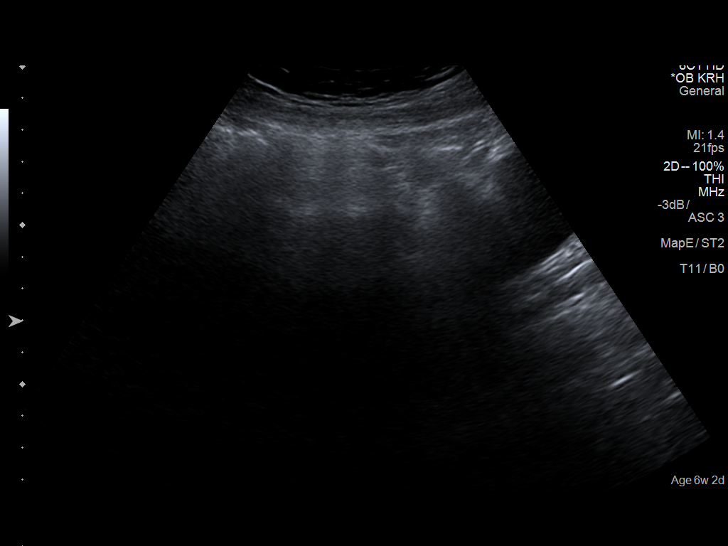
[im 9/80]
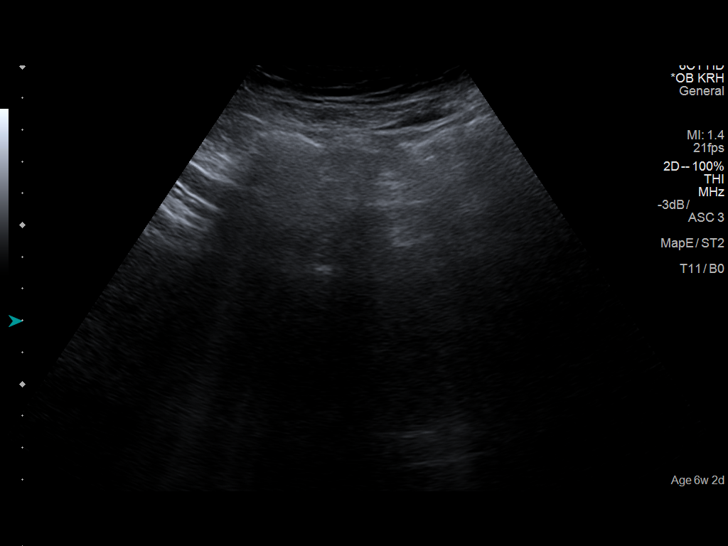
[im 15/80]
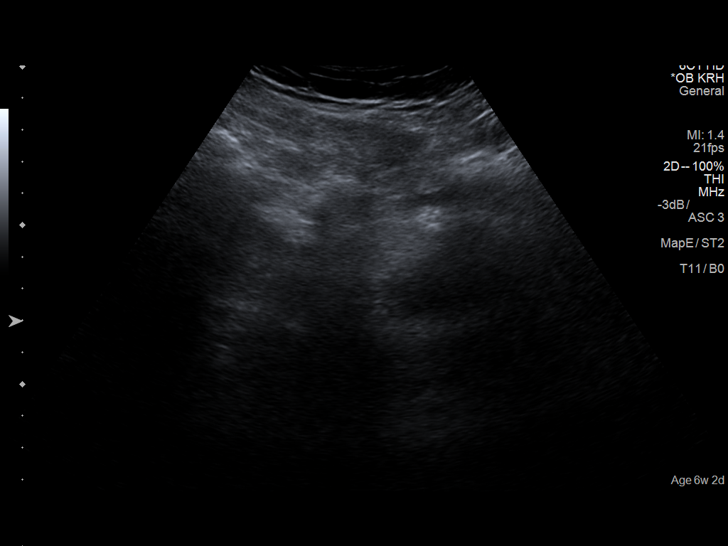
[im 21/80]
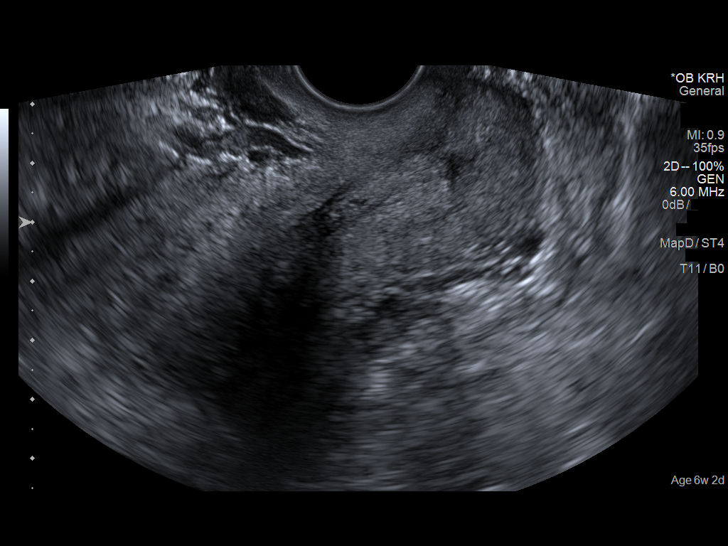
[im 27/80]
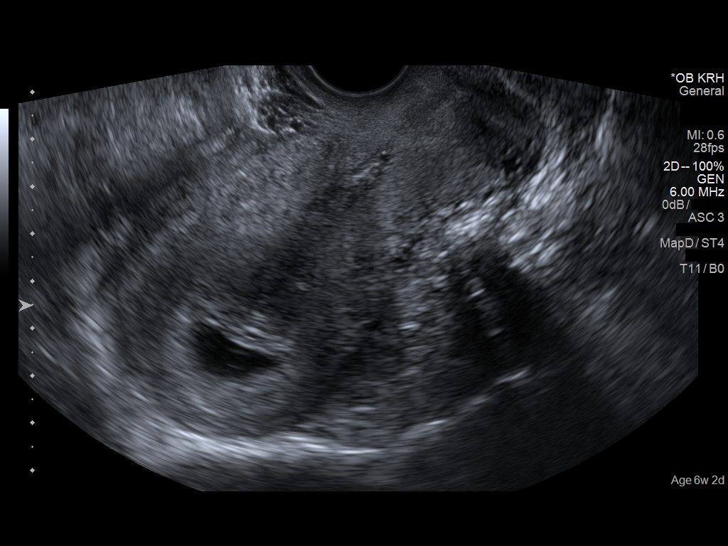
[im 33/80]
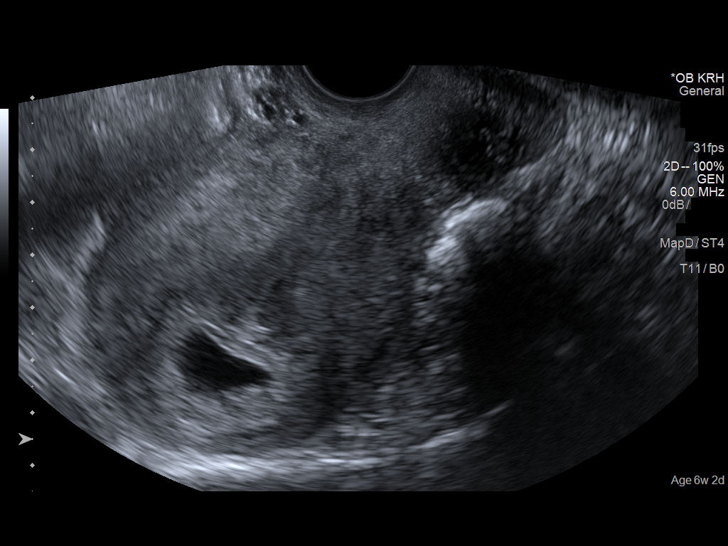
[im 39/80]
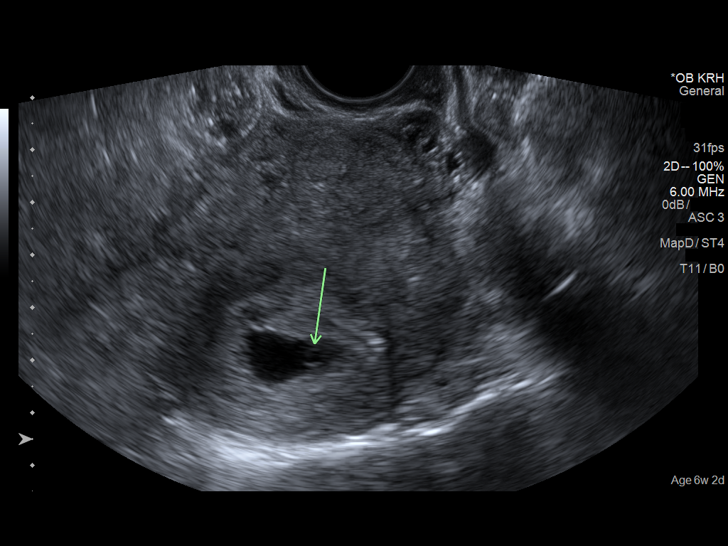
[im 44/80]
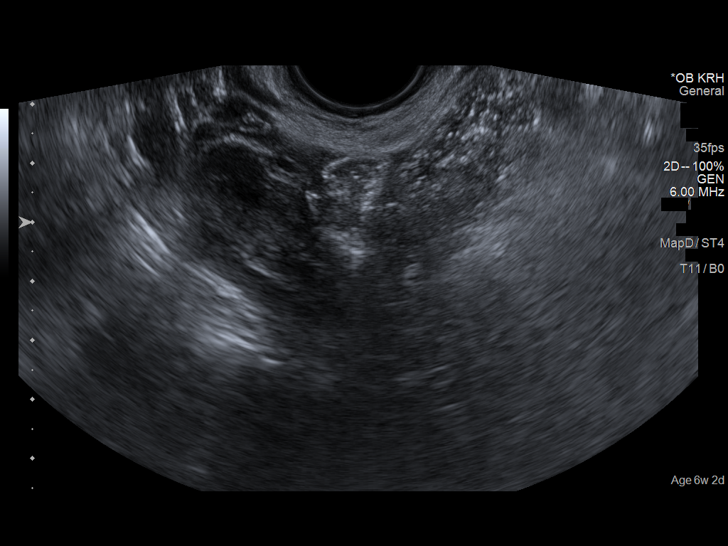
[im 50/80]
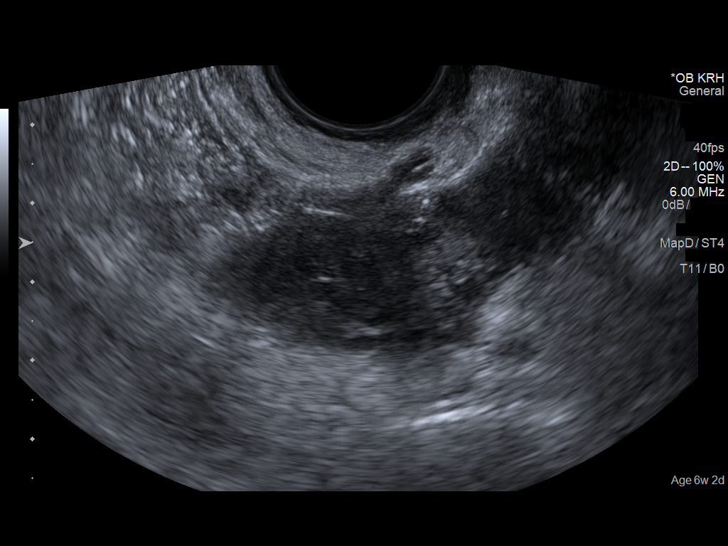
[im 56/80]
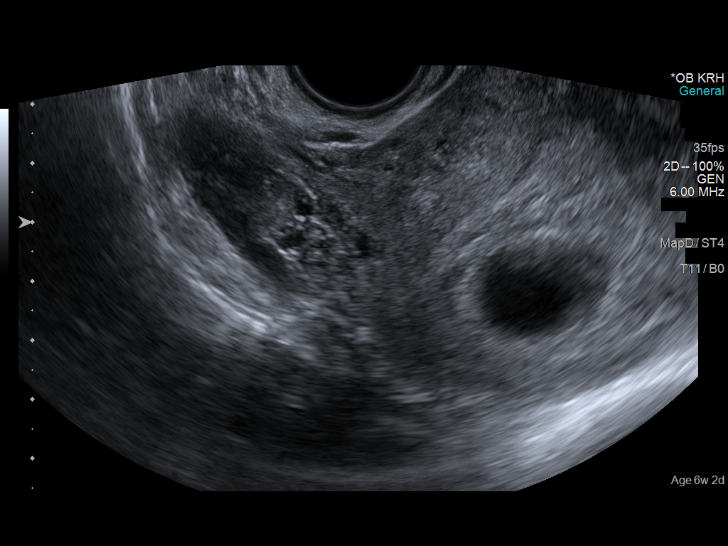
[im 62/80]
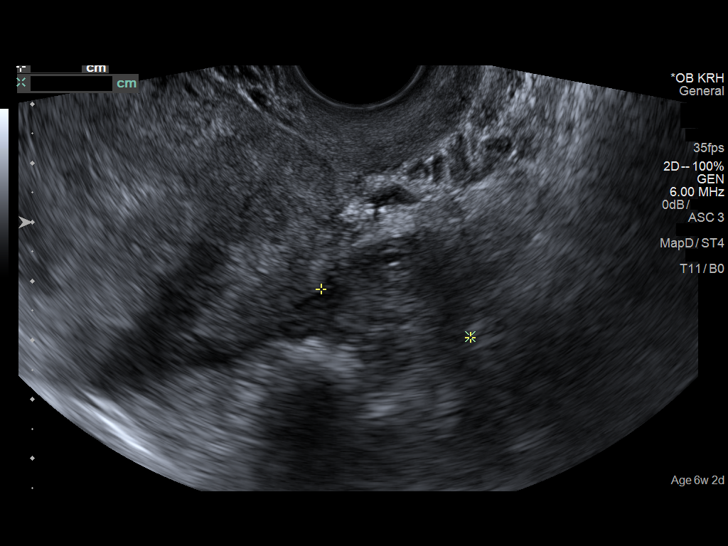
[im 68/80]
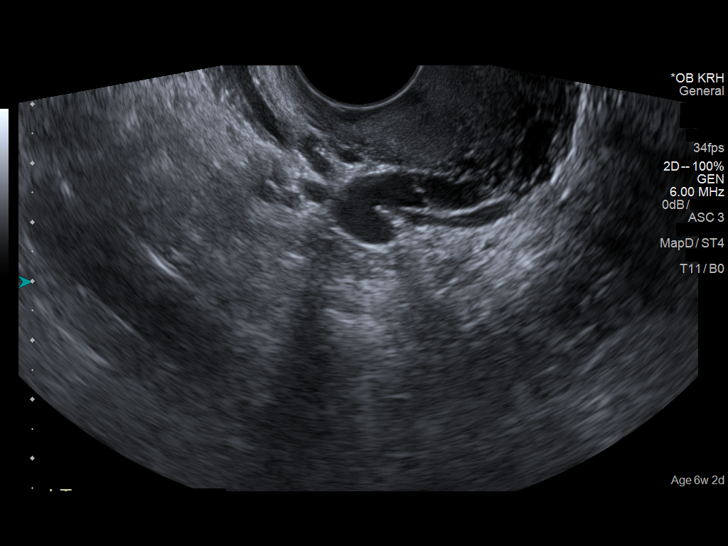
[im 74/80]
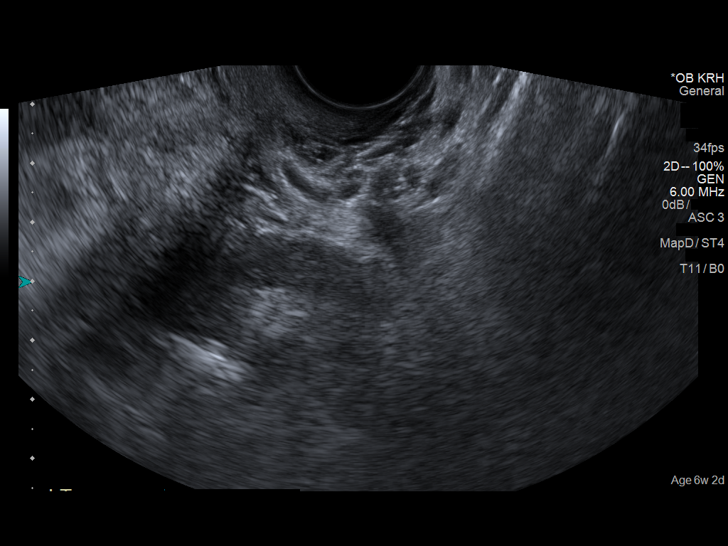
[im 80/80]
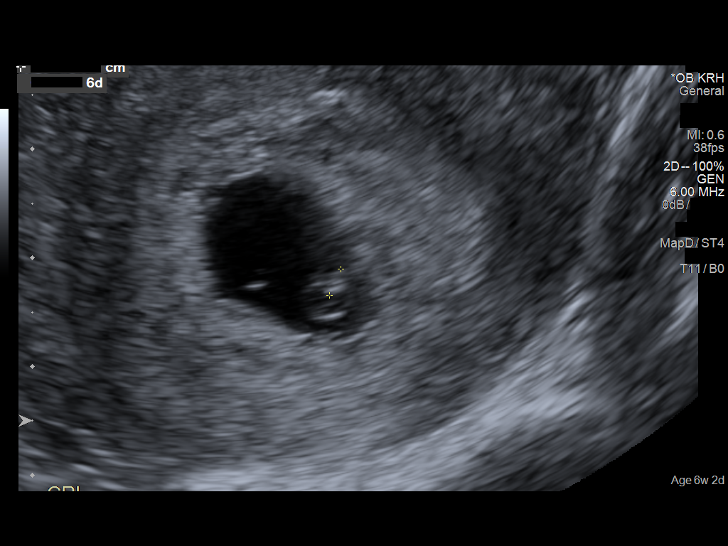

[14 of 28 positions shown; findings below may reference images not displayed]

FINDINGS: Intrauterine gestational sac: Single

Yolk sac:  Visualized.

Embryo:  Visualized.

Cardiac Activity: Visualized.

Heart Rate: 90  bpm

CRL:  2.9  mm   5 w   6 d                  US EDC: 04/04/2017

Subchorionic hemorrhage:  None visualized.

Maternal uterus/adnexae: No uterine masses. Ovaries are
unremarkable. No adnexal masses. No pelvic free fluid.
IMPRESSION: 1. Single live intrauterine pregnancy with a measured gestational
age of 5 weeks and 6 days, consistent with the expected gestational
age based on last menstrual period.
2. No emergent pregnancy complication.

## 2019-07-22 DIAGNOSIS — Z419 Encounter for procedure for purposes other than remedying health state, unspecified: Secondary | ICD-10-CM | POA: Diagnosis not present

## 2019-08-22 DIAGNOSIS — Z419 Encounter for procedure for purposes other than remedying health state, unspecified: Secondary | ICD-10-CM | POA: Diagnosis not present

## 2019-09-22 DIAGNOSIS — Z419 Encounter for procedure for purposes other than remedying health state, unspecified: Secondary | ICD-10-CM | POA: Diagnosis not present

## 2020-05-19 DIAGNOSIS — G8929 Other chronic pain: Secondary | ICD-10-CM | POA: Insufficient documentation

## 2020-05-19 DIAGNOSIS — E7800 Pure hypercholesterolemia, unspecified: Secondary | ICD-10-CM | POA: Insufficient documentation

## 2020-06-21 DIAGNOSIS — M545 Low back pain, unspecified: Secondary | ICD-10-CM | POA: Diagnosis not present

## 2020-06-21 DIAGNOSIS — L650 Telogen effluvium: Secondary | ICD-10-CM | POA: Diagnosis not present

## 2020-06-21 DIAGNOSIS — M5442 Lumbago with sciatica, left side: Secondary | ICD-10-CM | POA: Diagnosis not present

## 2020-06-21 DIAGNOSIS — B351 Tinea unguium: Secondary | ICD-10-CM | POA: Insufficient documentation

## 2020-06-21 DIAGNOSIS — G8929 Other chronic pain: Secondary | ICD-10-CM | POA: Diagnosis not present

## 2020-06-21 DIAGNOSIS — M4046 Postural lordosis, lumbar region: Secondary | ICD-10-CM | POA: Diagnosis not present

## 2020-06-21 DIAGNOSIS — E559 Vitamin D deficiency, unspecified: Secondary | ICD-10-CM | POA: Diagnosis not present

## 2020-07-13 DIAGNOSIS — N83201 Unspecified ovarian cyst, right side: Secondary | ICD-10-CM | POA: Insufficient documentation

## 2020-10-05 DIAGNOSIS — E559 Vitamin D deficiency, unspecified: Secondary | ICD-10-CM | POA: Insufficient documentation

## 2021-03-15 IMAGING — US US MFM FETAL BPP W/NONSTRESS
1 series · 14 of 28 positions shown · non-contrast
Comparison: none

[Series 1: us mfm fetal bpp w/nonstress · 32 acquisitions, 14 frames shown]
[im 2/32]
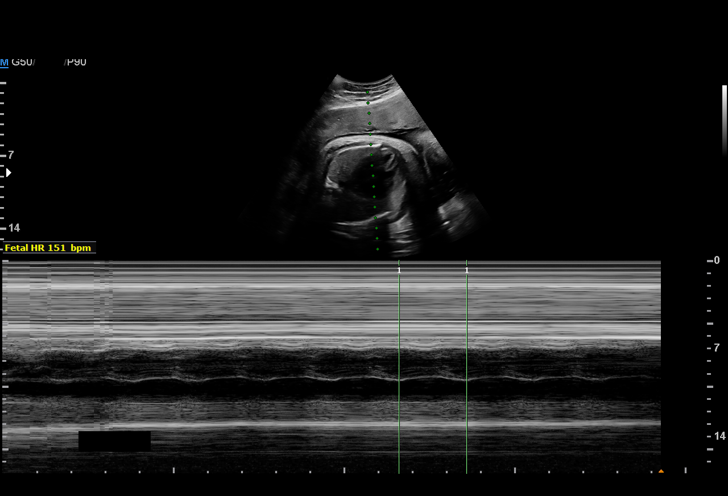
[im 4/32]
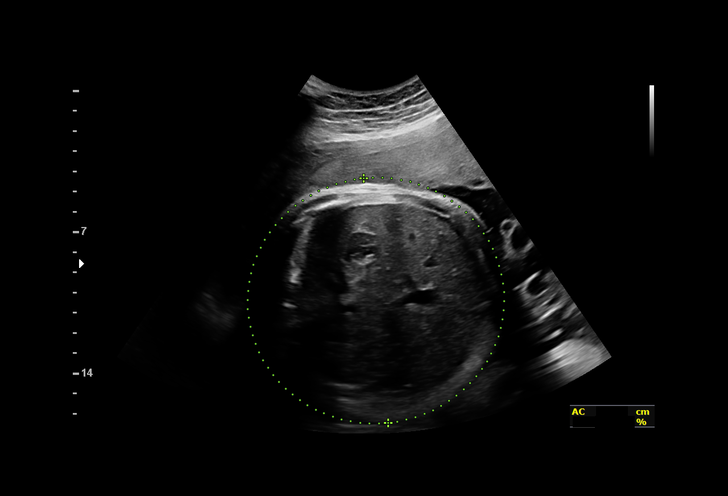
[im 6/32]
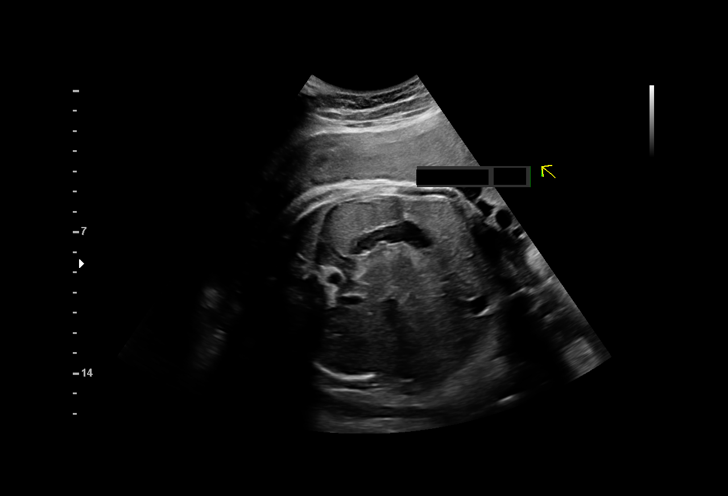
[im 9/32]
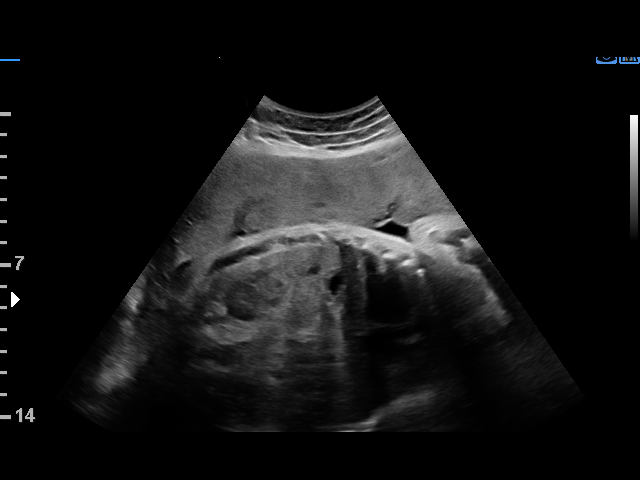
[im 11/32]
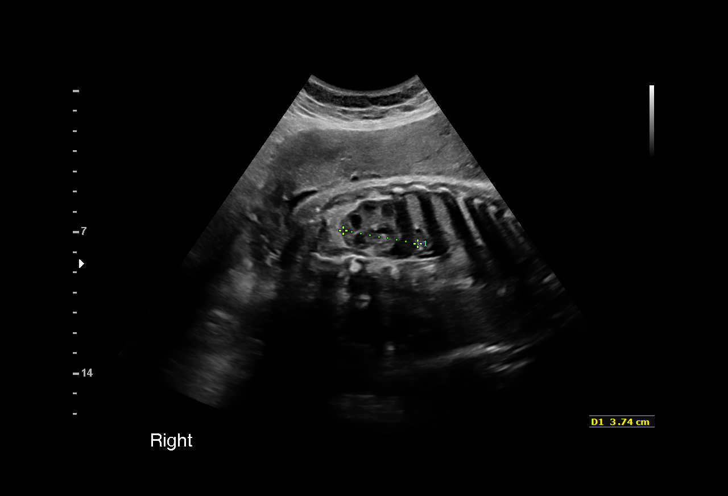
[im 13/32]
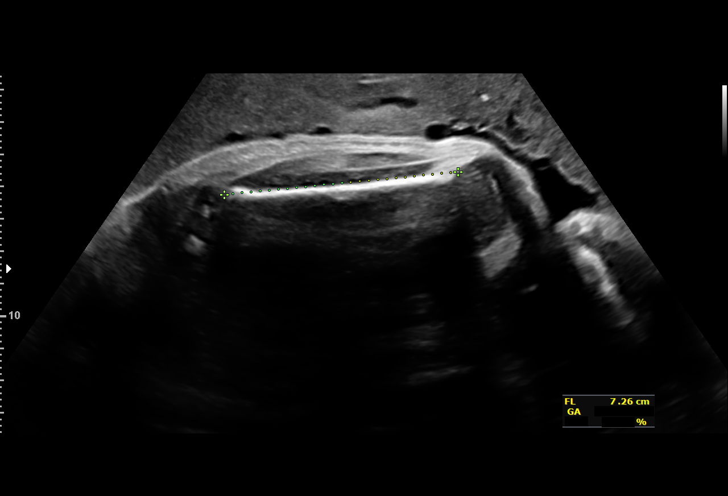
[im 15/32]
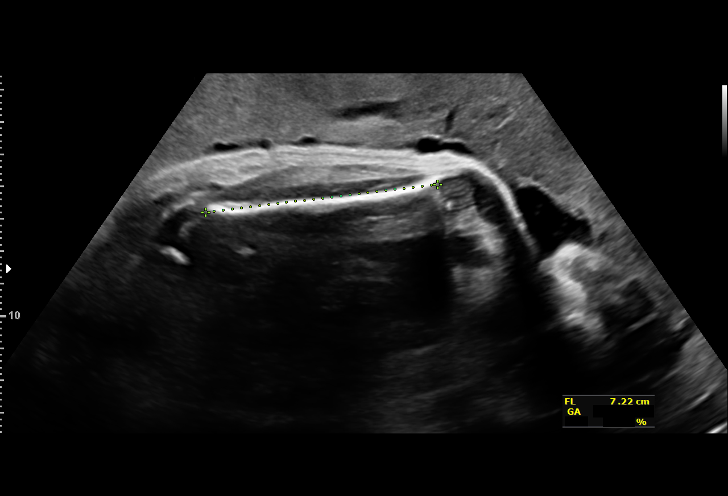
[im 18/32]
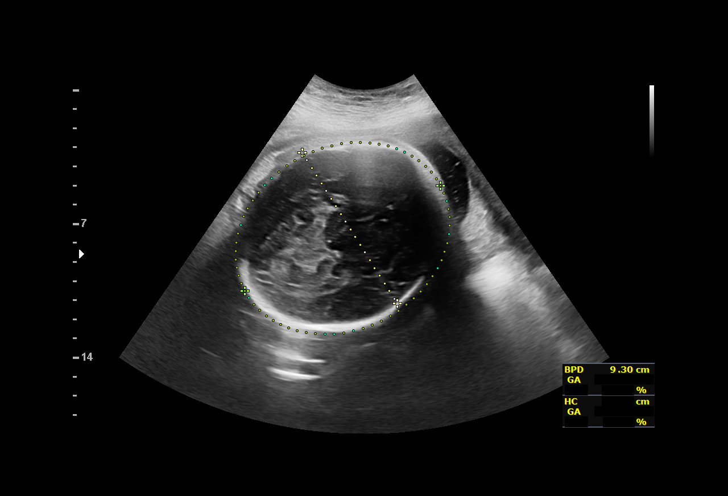
[im 20/32]
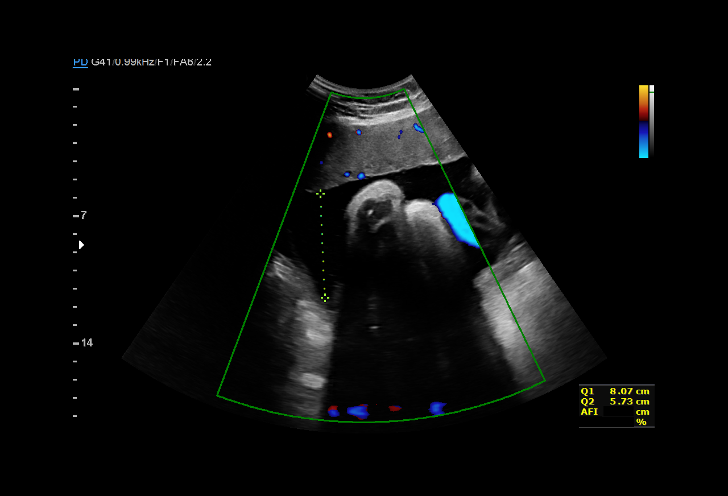
[im 22/32]
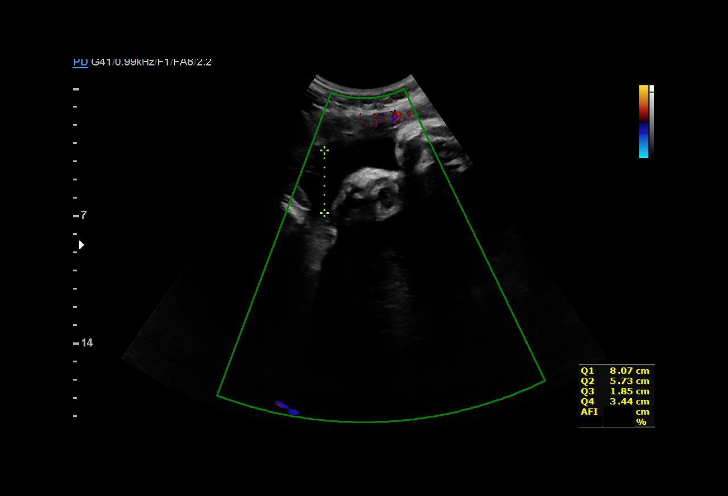
[im 25/32]
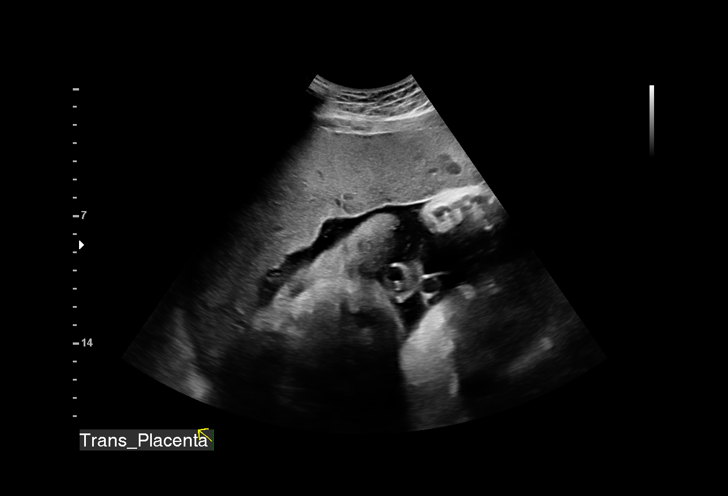
[im 27/32]
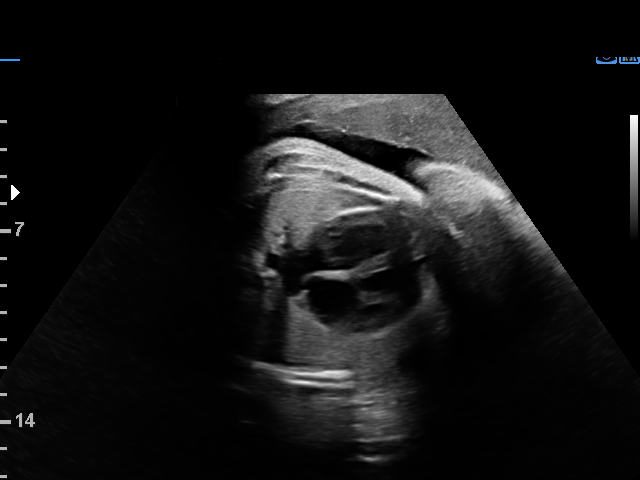
[im 29/32]
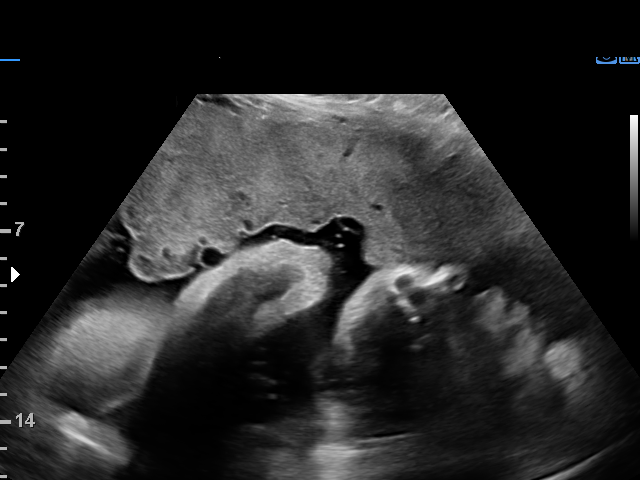
[im 32/32]
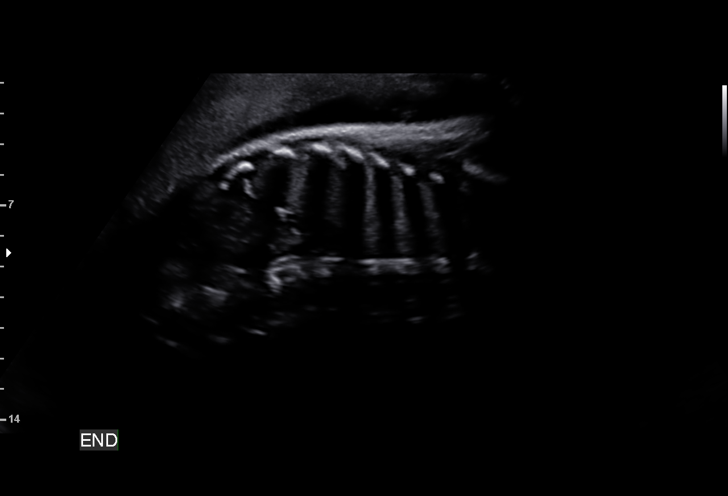

[14 of 28 positions shown; findings below may reference images not displayed]

[REDACTED]
                   [id number]

     W/NONSTRESS
 ----------------------------------------------------------------------

 ----------------------------------------------------------------------
Indications

  Advanced maternal age multigravida 35+,
  third trimester
  35 weeks gestation of pregnancy
  Cervical shortening, third trimester
 ----------------------------------------------------------------------
Fetal Evaluation

 Num Of Fetuses:         1
 Fetal Heart Rate(bpm):  151
 Cardiac Activity:       Observed
 Presentation:           Cephalic
 Placenta:               Anterior
 P. Cord Insertion:      Previously Visualized

 Amniotic Fluid
 AFI FV:      Within normal limits

 AFI Sum(cm)     %Tile       Largest Pocket(cm)
 19.09           71

 RUQ(cm)       RLQ(cm)       LUQ(cm)        LLQ(cm)

Biophysical Evaluation
 Amniotic F.V:   Within normal limits       F. Tone:        Observed
 F. Movement:    Observed                   N.S.T:          Reactive
 F. Breathing:   Not Observed               Score:          [DATE]
Biometry

 BPD:        93  mm     G. Age:  37w 6d         96  %    CI:        77.96   %    70 - 86
                                                         FL/HC:      21.7   %    20.1 -
 HC:      333.3  mm     G. Age:  38w 0d         76  %    HC/AC:      0.85        0.93 -
 AC:      390.5  mm     G. Age:  N/A          > 97  %    FL/BPD:     77.6   %    71 - 87
 FL:       72.2  mm     G. Age:  37w 0d         76  %    FL/AC:      18.5   %    20 - 24

 Est. FW:    7404  gm      9 lb 2 oz   > 90  %
OB History

 Gravidity:    4         Term:   2
 TOP:          1        Living:  2
Gestational Age

 LMP:           35w 5d        Date:  10/18/17                 EDD:   07/25/18
 U/S Today:     37w 4d                                        EDD:   07/12/18
 Best:          35w 5d     Det. By:  LMP  (10/18/17)          EDD:   07/25/18
Anatomy

 Cranium:               Appears normal         Aortic Arch:            Previously seen
 Cavum:                 Previously seen        Ductal Arch:            Previously seen
 Ventricles:            Previously seen        Diaphragm:              Appears normal
 Choroid Plexus:        Previously seen        Stomach:                Appears normal, left
                                                                       sided
 Cerebellum:            Previously seen        Abdomen:                Appears normal
 Posterior Fossa:       Previously seen        Abdominal Wall:         Previously seen
 Nuchal Fold:           Previously seen        Cord Vessels:           Previously seen
 Face:                  Orbits and profile     Kidneys:                Appear normal
                        previously seen
 Lips:                  Previously seen        Bladder:                Appears normal
 Thoracic:              Appears normal         Spine:                  Previously seen
 Heart:                 Appears normal         Upper Extremities:      Previously seen
                        (4CH, axis, and
                        situs)
 RVOT:                  Previously seen        Lower Extremities:      Previously seen
 LVOT:                  Previously seen

 Other:  Heels previously visualized. Open hands/5th digits, and Nasal bone
         previously visualized. Technically difficult due to fetal position.
Cervix Uterus Adnexa

 Cervix
 Not visualized (advanced GA >89wks)
Impression

 The estimated fetal weight is at greater than the 90th
 percentile. Abdominal circumference measures at greater
 than the 95th percentile. Amniotic fluid is normal and good
 fetal activity is seen. Fetal breathing movements did not meet
 the criteria (BPP). NST is reactive. BPP [DATE].

 I explained the findings with help of an interpreter
 (STRATUS).
Recommendations

 -Continue weekly BPP till delivery.
                 De Antoni, Ginesio

## 2021-03-20 IMAGING — US US MFM FETAL BPP W/NONSTRESS
1 series · 14 of 28 positions shown · non-contrast
Comparison: none

[Series 1: us mfm fetal bpp w/nonstress · 34 acquisitions, 14 frames shown]
[im 2/34]
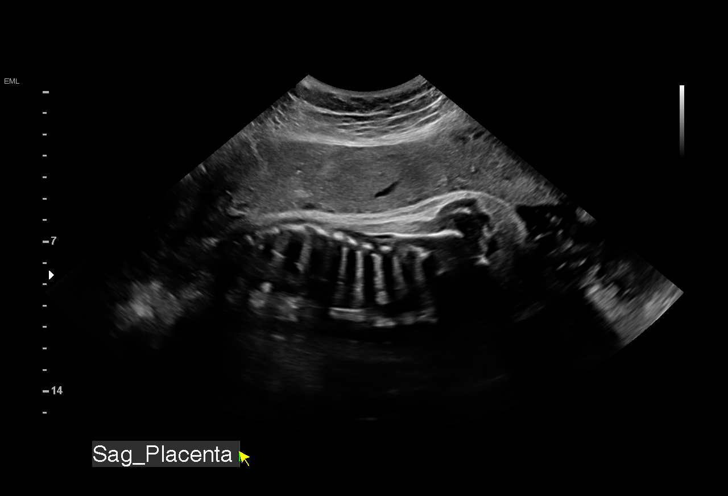
[im 4/34]
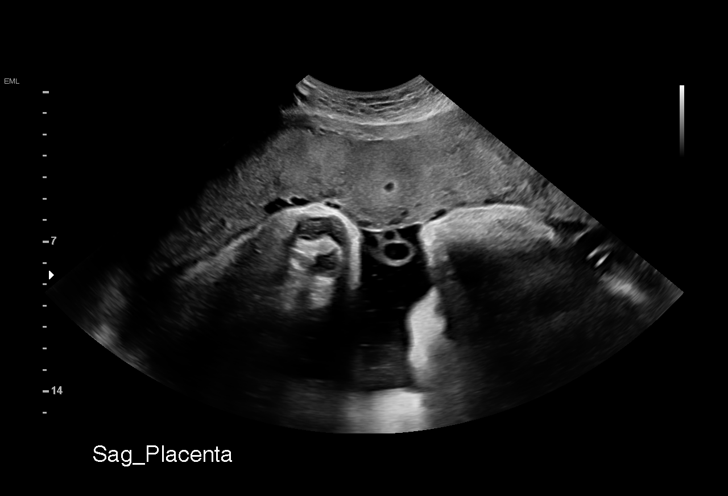
[im 7/34]
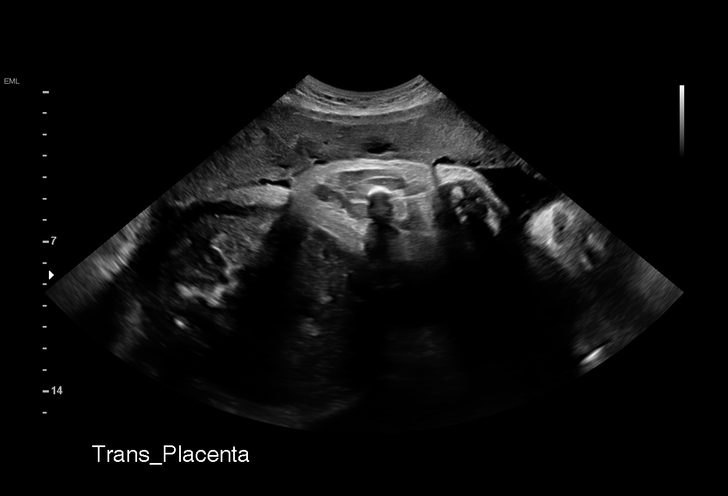
[im 9/34]
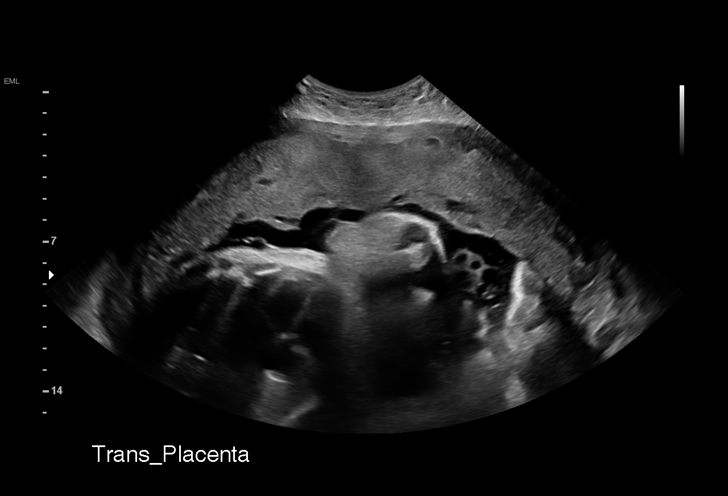
[im 12/34]
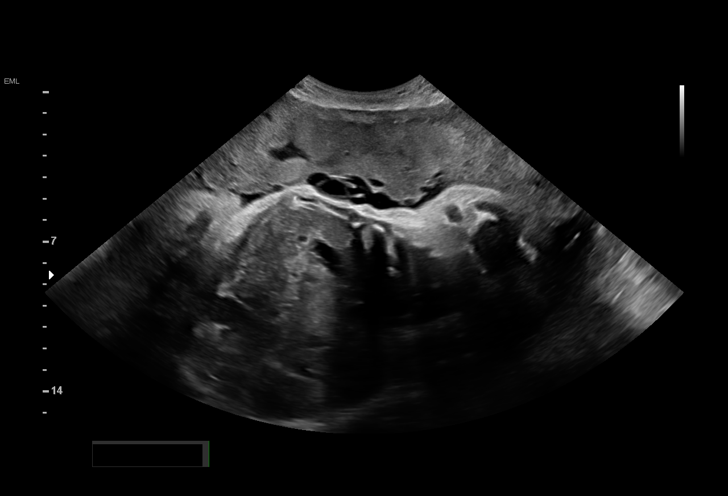
[im 14/34]
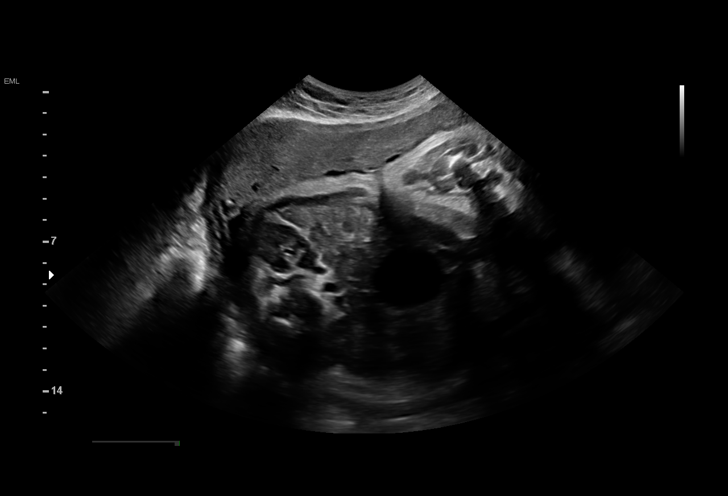
[im 16/34]
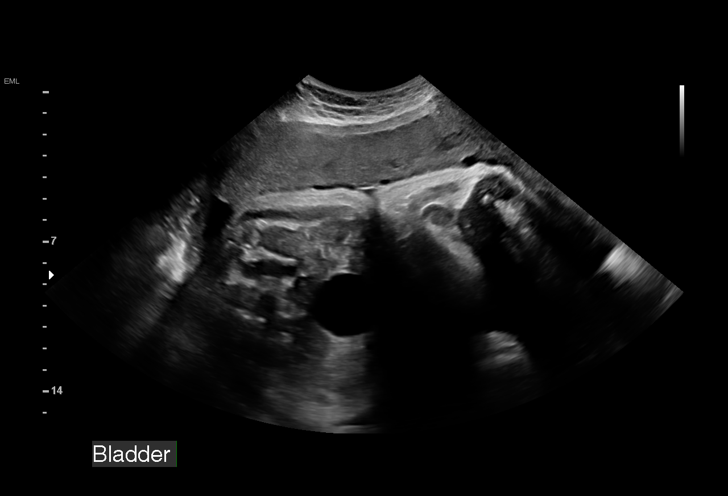
[im 19/34]
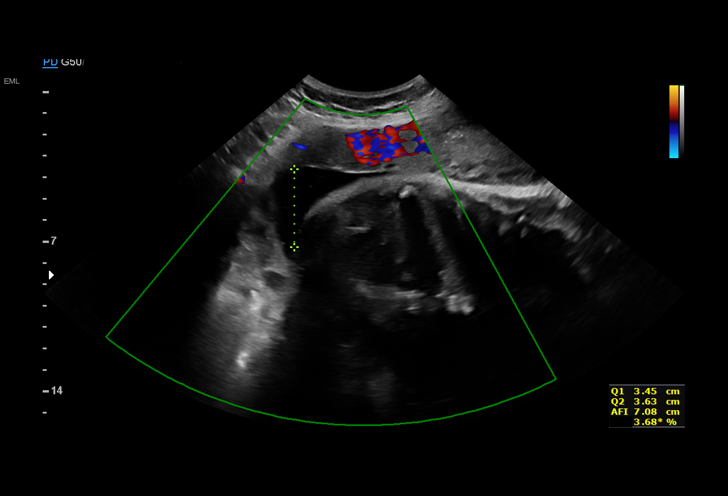
[im 21/34]
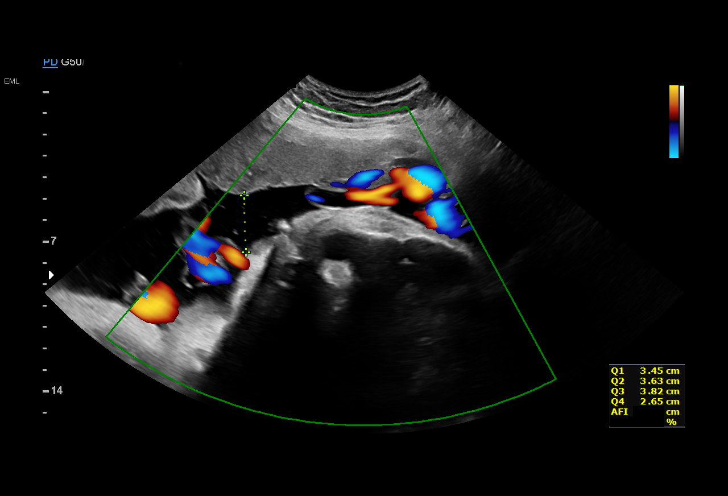
[im 24/34]
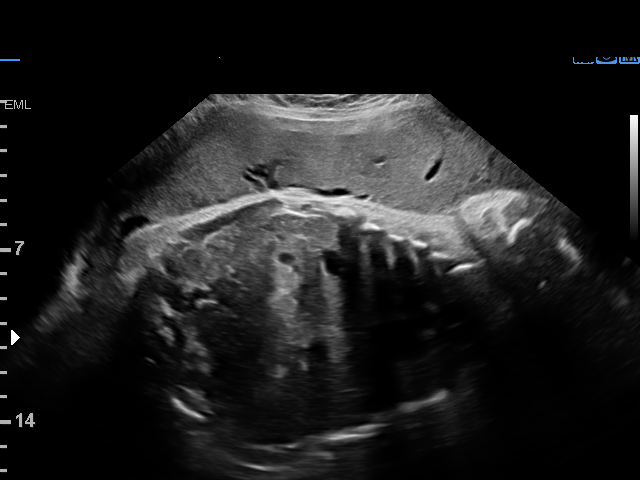
[im 26/34]
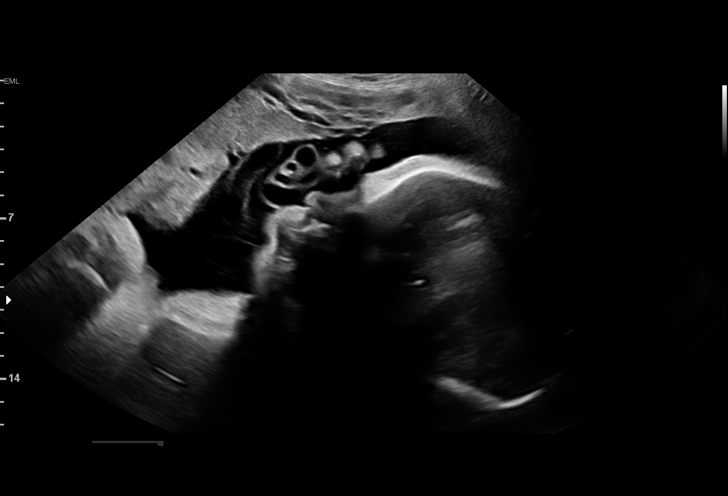
[im 29/34]
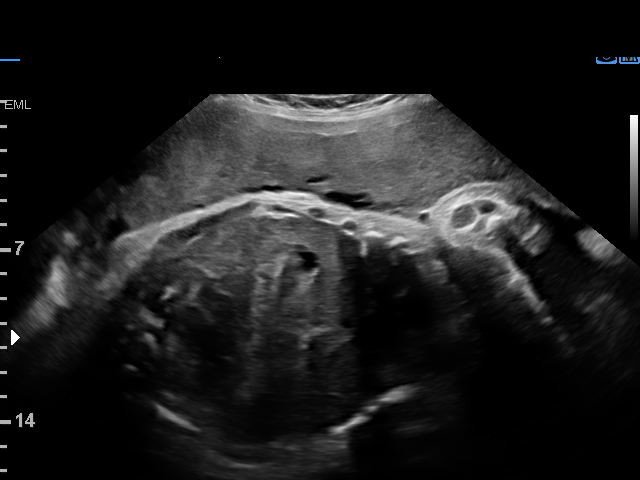
[im 31/34]
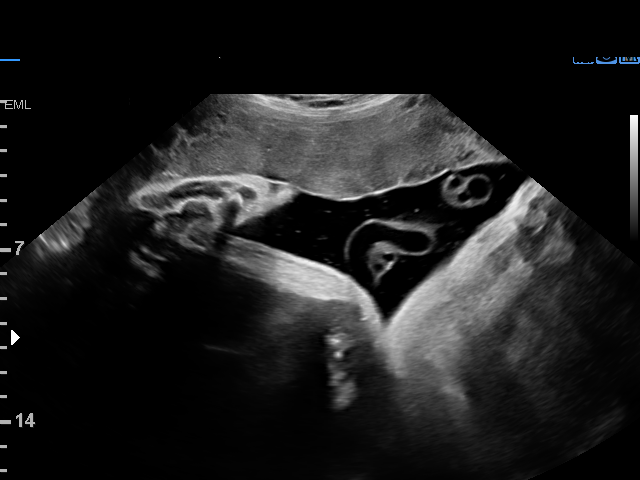
[im 34/34]
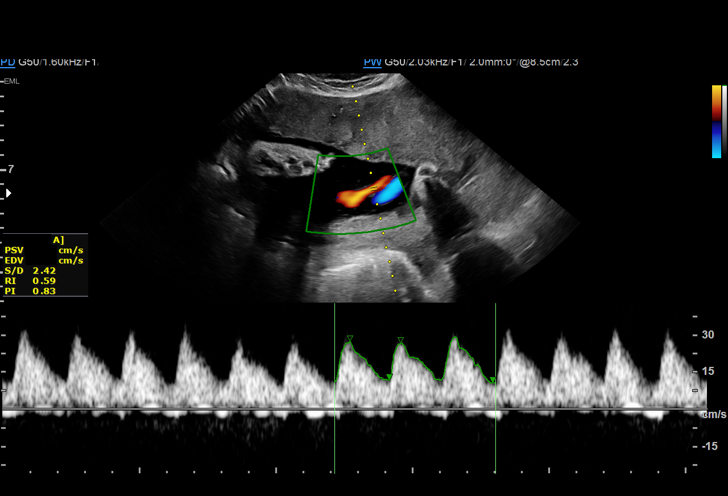

[14 of 28 positions shown; findings below may reference images not displayed]

[REDACTED]
                   [id number]

     W/NONSTRESS
 ----------------------------------------------------------------------

 ----------------------------------------------------------------------
Indications

  36 weeks gestation of pregnancy
  Advanced maternal age multigravida 35+,
  third trimester
  Cervical shortening, third trimester
 ----------------------------------------------------------------------
Vital Signs

 BMI:
Fetal Evaluation

 Num Of Fetuses:         1
 Fetal Heart Rate(bpm):  155
 Cardiac Activity:       Observed
 Presentation:           Cephalic
 Placenta:               Anterior

 Amniotic Fluid
 AFI FV:      Within normal limits

 AFI Sum(cm)     %Tile       Largest Pocket(cm)
 13.55           49

 RUQ(cm)       RLQ(cm)       LUQ(cm)        LLQ(cm)

Biophysical Evaluation

 Amniotic F.V:   Within normal limits       F. Tone:        Observed
 F. Movement:    Observed                   N.S.T:          Reactive
 F. Breathing:   Observed                   Score:          [DATE]
OB History

 Gravidity:    4         Term:   2
 TOP:          1        Living:  2
Gestational Age

 LMP:           36w 3d        Date:  10/18/17                 EDD:   07/25/18
 Best:          36w 3d     Det. By:  LMP  (10/18/17)          EDD:   07/25/18
Anatomy

 Diaphragm:             Appears normal         Kidneys:                Appear normal
 Stomach:               Appears normal, left   Bladder:                Appears normal
                        sided
Doppler - Fetal Vessels

 Umbilical Artery
  S/D     %tile
 2.42       52

Impression

 Advanced maternal age. Patient returned for antenatal
 testing. Her BPP score yesterday was [DATE].

 Amniotic fluid is normal and good fetal activity is seen.
 Antenatal testing is reassuring. Umbilical artery Doppler
 showed normal forward diastolic flow. NST is reactive. BPP
 [DATE].

 I counseled the patient with help of Mandarin interpreter
 (STRATUS). Patient still has decreased fetal movements. I
 informed her that antenatal testing has limitations in detecting
 fetal compromise.
 I recommended increasing BPP and NST twice weekly.
 Advanced maternal age is associated with a slight increase in
 stillbirth. It is reasonable to deliver at 38 weeks (risk of
 continuation of pregnancy seems to overweigh newborn
 complications from early term delivery).

 If BPP is not reassuring (less than [DATE] and patient
 continues to have decreased fetal movements, delivery at 37
 weeks would be recommended.
Recommendations

 -Twice-weekly BPP and NST.
 -Delivery at 38 weeks or earlier (see above).
                 Jim, Drey

## 2023-07-09 ENCOUNTER — Ambulatory Visit: Payer: Self-pay

## 2023-07-09 NOTE — Telephone Encounter (Signed)
   Copied from CRM [id number]. Topic: Clinical - Red Word Triage >> Jul 09, 2023  1:13 PM Rachelle R wrote: [facility] that prompted transfer to Nurse Triage: Patient has been experiencing dizziness and fatigue for the past two weeks. Feels like motion sickness but she isnt traveling or moving around. Reason for Disposition  [1] MODERATE dizziness (e.g., vertigo; feels very unsteady, interferes with normal activities) AND [2] has NOT been evaluated by doctor (or NP/PA) for this  Answer Assessment - Initial Assessment Questions 1. DESCRIPTION: Describe your dizziness.     Motion sickness  2. VERTIGO: Do you feel like either you or the room is spinning or tilting?      motion 3. LIGHTHEADED: Do you feel lightheaded? (e.g., somewhat faint, woozy, weak upon standing)     yes 4. SEVERITY: How bad is it?  Can you walk?   - MILD: Feels slightly dizzy and unsteady, but is walking normally.   - MODERATE: Feels unsteady when walking, but not falling; interferes with normal activities (e.g., school, work).   - SEVERE: Unable to walk without falling, or requires assistance to walk without falling.     Mild 5. ONSET:  When did the dizziness begin?     Two weeks ago  Protocols used: Dizziness - Vertigo-A-AH

## 2023-07-30 ENCOUNTER — Ambulatory Visit: Admitting: Family Medicine
